# Patient Record
Sex: Male | Born: 1955 | Race: White | Hispanic: No | Marital: Married | Smoking: Never smoker
Health system: Southern US, Community
[De-identification: ages and names within clinical notes are randomized; demographics above are authoritative.]

## PROBLEM LIST (undated history)

## (undated) DIAGNOSIS — G2581 Restless legs syndrome: Secondary | ICD-10-CM

## (undated) DIAGNOSIS — N529 Male erectile dysfunction, unspecified: Secondary | ICD-10-CM

## (undated) DIAGNOSIS — I10 Essential (primary) hypertension: Secondary | ICD-10-CM

## (undated) DIAGNOSIS — T7840XA Allergy, unspecified, initial encounter: Secondary | ICD-10-CM

## (undated) DIAGNOSIS — I2510 Atherosclerotic heart disease of native coronary artery without angina pectoris: Secondary | ICD-10-CM

## (undated) DIAGNOSIS — E785 Hyperlipidemia, unspecified: Secondary | ICD-10-CM

## (undated) HISTORY — DX: Allergy, unspecified, initial encounter: T78.40XA

## (undated) HISTORY — DX: Hyperlipidemia, unspecified: E78.5

## (undated) HISTORY — DX: Restless legs syndrome: G25.81

## (undated) HISTORY — PX: FOREIGN BODY REMOVAL ABDOMINAL: SHX5319

## (undated) HISTORY — PX: CARDIAC CATHETERIZATION: SHX172

## (undated) HISTORY — DX: Male erectile dysfunction, unspecified: N52.9

## (undated) HISTORY — PX: COLONOSCOPY: SHX174

## (undated) HISTORY — DX: Essential (primary) hypertension: I10

## (undated) HISTORY — PX: WISDOM TOOTH EXTRACTION: SHX21

---

## 2007-02-21 ENCOUNTER — Ambulatory Visit: Payer: Self-pay | Admitting: Internal Medicine

## 2007-03-03 ENCOUNTER — Ambulatory Visit: Payer: Self-pay | Admitting: Internal Medicine

## 2015-10-11 ENCOUNTER — Other Ambulatory Visit: Payer: Self-pay | Admitting: Family Medicine

## 2015-10-11 DIAGNOSIS — R55 Syncope and collapse: Secondary | ICD-10-CM

## 2015-10-17 ENCOUNTER — Encounter: Payer: Self-pay | Admitting: *Deleted

## 2015-10-17 ENCOUNTER — Ambulatory Visit (INDEPENDENT_AMBULATORY_CARE_PROVIDER_SITE_OTHER): Payer: BLUE CROSS/BLUE SHIELD

## 2015-10-17 DIAGNOSIS — I10 Essential (primary) hypertension: Secondary | ICD-10-CM | POA: Insufficient documentation

## 2015-10-17 NOTE — Progress Notes (Signed)
Patient ID: Cory Hampton, male   DOB: [birth year], 60 y.o.   MRN: [id number] 24 hour ambulatory blood pressure monitor applied to patient.

## 2015-10-22 ENCOUNTER — Inpatient Hospital Stay: Admission: RE | Admit: 2015-10-22 | Payer: Self-pay | Source: Ambulatory Visit

## 2015-11-14 ENCOUNTER — Ambulatory Visit
Admission: RE | Admit: 2015-11-14 | Discharge: 2015-11-14 | Disposition: A | Discharge status: Home / Self Care | Payer: BLUE CROSS/BLUE SHIELD | Source: Ambulatory Visit | Attending: Family Medicine | Admitting: Family Medicine

## 2015-11-14 DIAGNOSIS — R55 Syncope and collapse: Secondary | ICD-10-CM

## 2015-12-11 ENCOUNTER — Encounter: Payer: Self-pay | Admitting: Neurology

## 2015-12-11 ENCOUNTER — Ambulatory Visit (INDEPENDENT_AMBULATORY_CARE_PROVIDER_SITE_OTHER): Payer: BLUE CROSS/BLUE SHIELD | Admitting: Neurology

## 2015-12-11 VITALS — BP 154/76 | HR 84 | Ht 68.75 in | Wt 166.6 lb

## 2015-12-11 DIAGNOSIS — R930 Abnormal findings on diagnostic imaging of skull and head, not elsewhere classified: Secondary | ICD-10-CM | POA: Diagnosis not present

## 2015-12-11 DIAGNOSIS — I10 Essential (primary) hypertension: Secondary | ICD-10-CM | POA: Diagnosis not present

## 2015-12-11 DIAGNOSIS — R9089 Other abnormal findings on diagnostic imaging of central nervous system: Secondary | ICD-10-CM

## 2015-12-11 NOTE — Progress Notes (Signed)
NEUROLOGY CONSULTATION NOTE  Lyman Balingit MRN: [id number] DOB: 1956-06-16  Referring provider: Dr. Paulino Rily Primary care provider: Dr. Paulino Rily  Reason for consult:  Abnormal MRI of brain  HISTORY OF PRESENT ILLNESS: Cory Hampton is a 60 year old male with hypertension who presents for abnormal MRI of brain.  History obtained by patient and PCP note.  Imaging of brain MRI reviewed.  He has hypertension as well as episodes of orthostatic hypotension.  When he was first put on antihypertensives several years ago, he became hypotensive and passed out.  He had a couple of other syncopal episodes as well.  He feels dizzy, specifically lightheadedness, with quick movements at times.  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  He denies memory or behavioral changes.    Both of his parents had TIAs.  His father passed away from Dementia with Lewy Body Disease.  PAST MEDICAL HISTORY: Past Medical History  Diagnosis Date  . Hypertension     PAST SURGICAL HISTORY: History reviewed. No pertinent past surgical history.  MEDICATIONS: No current outpatient prescriptions on file prior to visit.   No current facility-administered medications on file prior to visit.    ALLERGIES: Allergies  Allergen Reactions  . Statins Other (See Comments)    Weakness, join aches    FAMILY HISTORY: Family History  Problem Relation Age of Onset  . Stroke Mother   . Dementia Father   . Stroke Father     SOCIAL HISTORY: Social History   Social History  . Marital Status: Married    Spouse Name: N/A  . Number of Children: N/A  . Years of Education: N/A   Occupational History  . Not on file.   Social History Main Topics  .  Smoking status: Never Smoker   . Smokeless tobacco: Not on file  . Alcohol Use: Not on file  . Drug Use: Not on file  . Sexual Activity: Not on file   Other Topics Concern  . Not on file   Social History Narrative  . No narrative on file    REVIEW OF SYSTEMS: Constitutional: No fevers, chills, or sweats, no generalized fatigue, change in appetite Eyes: No visual changes, double vision, eye pain Ear, nose and throat: No hearing loss, ear pain, nasal congestion, sore throat Cardiovascular: No chest pain, palpitations Respiratory:  No shortness of breath at rest or with exertion, wheezes GastrointestinaI: No nausea, vomiting, diarrhea, abdominal pain, fecal incontinence Genitourinary:  No dysuria, urinary retention or frequency Musculoskeletal:  No neck pain, back pain Integumentary: No rash, pruritus, skin lesions Neurological: as above Psychiatric: No depression, insomnia, anxiety Endocrine: No palpitations, fatigue, diaphoresis, mood swings, change in appetite, change in weight, increased thirst Hematologic/Lymphatic:  No anemia, purpura, petechiae. Allergic/Immunologic: no itchy/runny eyes, nasal congestion, recent allergic reactions, rashes  PHYSICAL EXAM: Filed Vitals:   12/11/15 1311  BP: 154/76  Pulse: 84   General: No acute distress.  Patient appears well-groomed.  Head:  Normocephalic/atraumatic Eyes:  fundi examined but not visualized Neck: supple, no paraspinal tenderness, full range of motion Back: No paraspinal tenderness Heart: regular rate and rhythm Lungs: Clear to auscultation bilaterally. Vascular: No carotid bruits. Neurological Exam: Mental status: alert and oriented to person, place, and time, recent and remote memory  intact, fund of knowledge intact, attention and concentration intact, speech fluent and not dysarthric, language intact. Cranial nerves: CN I: not tested CN II: pupils equal, round and reactive to light, visual fields intact CN III, IV,  VI:  full range of motion, no nystagmus, no ptosis CN V: facial sensation intact CN VII: upper and lower face symmetric CN VIII: hearing intact CN IX, X: gag intact, uvula midline CN XI: sternocleidomastoid and trapezius muscles intact CN XII: tongue midline Bulk & Tone: normal, no fasciculations. Motor:  5/5 throughout Sensation: temperature and vibration sensation intact. Deep Tendon Reflexes:  2+ throughout, toes downgoing.  Finger to nose testing:  Without dysmetria.  Heel to shin:  Without dysmetria.  Gait:  Normal station and stride.  Able to turn and tandem walk. Romberg negative.  IMPRESSION: Mild bifrontal atrophy and punctate remote microhemorrhage on MRI of brain.   There appears to be two tiny old microhemorrhages next to each other.  This is likely sequela of hypertension.  Trauma may have caused this as well.  He does not exhibit scattered lobar microhemorrhages to suggest cerebral amyloid angiopathy.  The frontal atrophy, which is mild, is probably just his normal brain configuration and not necessarily pathologic.  He does not exhibit any signs of frontal lobe dysfunction, such as may be seen in frontotemporal dementia  HTN  PLAN: Repeat imaging is not indicated unless he exhibits new clinical signs or symptoms to suggest further evaluation.  Continue to optimize blood pressure control.  Follow up blood pressure with Dr. Paulino RilyWolters.  Follow up as needed.  Thank you for allowing me to take part in the care of this patient.  Shon MilletAdam Jaffe, DO  CC:  Mila PalmerSharon Wolters, MD

## 2015-12-11 NOTE — Patient Instructions (Signed)
I don't think the findings on the MRI are anything to worry about. 1.  You had tiny old microbleed in the brain, which may have been related to the hypertension 2.  There is some very mild shrinkage in the front of the brain, but that just may be how your brain looks.  Your exam and history is unremarkable.  No further testing is needed.  Follow up as needed.

## 2015-12-11 NOTE — Progress Notes (Signed)
Chart forwarded.  

## 2016-05-19 IMAGING — MR MR HEAD W/O CM
10 series · 48 of 48 positions shown · non-contrast
Comparison: None.

CLINICAL DATA: Presyncope

EXAM:
MRI HEAD WITHOUT CONTRAST
TECHNIQUE: Multiplanar, multiecho pulse sequences of the brain and surrounding
structures were obtained without intravenous contrast.

[Series 5: T1 · sagittal · 4.0mm · 0.75mm/px · 2 of 27 slices shown (1 of 2)]
[im 1/27]
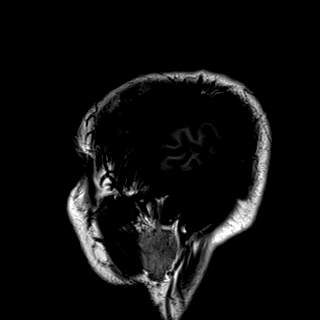
[im 27/27]
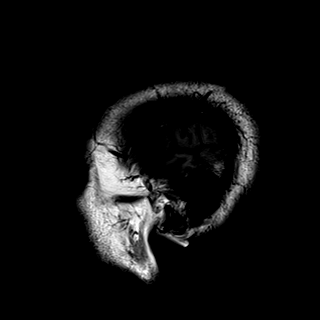

[Series 6: T2 · axial · 4.0mm · 0.36mm/px · z∈[-57,+89]mm · 2 of 29 slices shown (1 of 2)]
[im 1/29]
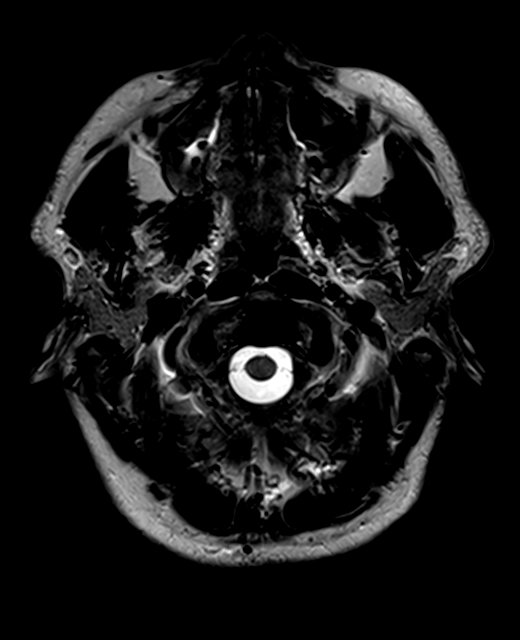
[im 29/29]
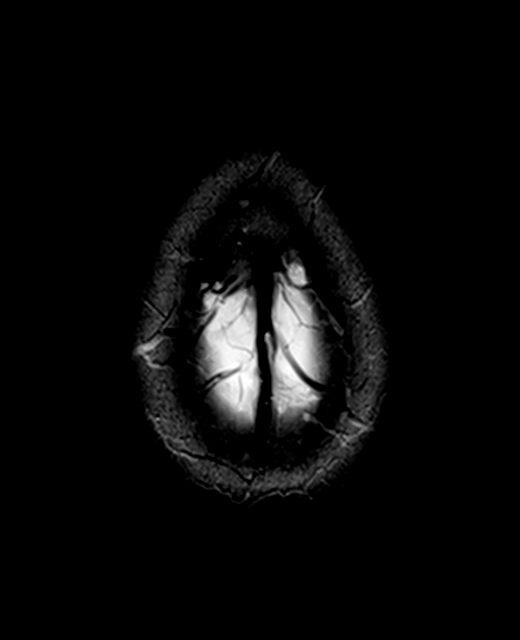

[Series 7: DWI · axial · 3.0mm · 1.44mm/px · z∈[-59,+89]mm · 8 of 92 slices shown (1 of 4)]
[im 1/92]
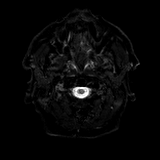
[im 14/92]
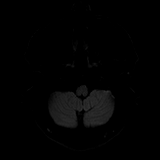
[im 27/92]
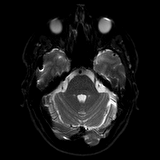
[im 40/92]
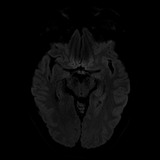
[im 53/92]
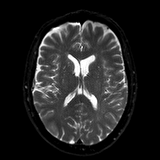
[im 66/92]
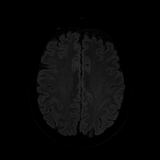
[im 79/92]
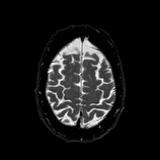
[im 92/92]
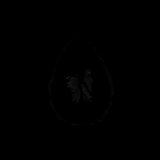

[Series 8: DWI · axial · 3.0mm · 1.44mm/px · z∈[-59,+89]mm · 4 of 46 slices shown (2 of 4)]
[im 1/46]
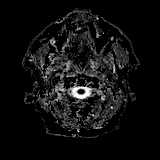
[im 16/46]
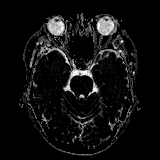
[im 31/46]
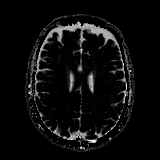
[im 46/46]
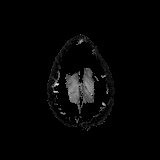

[Series 9: DWI · coronal · 5.0mm · 1.44mm/px · 5 of 60 slices shown (3 of 4)]
[im 1/60]
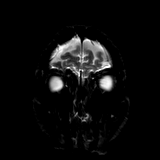
[im 15/60]
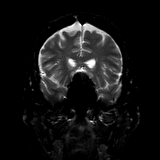
[im 30/60]
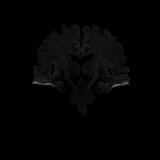
[im 45/60]
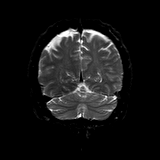
[im 60/60]
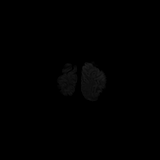

[Series 10: DWI · coronal · 5.0mm · 1.44mm/px · 3 of 30 slices shown (4 of 4)]
[im 1/30]
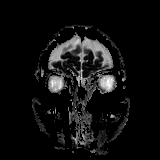
[im 15/30]
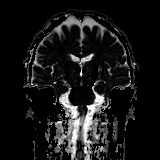
[im 30/30]
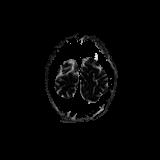

[Series 11: FLAIR · axial · 4.0mm · 0.72mm/px · z∈[-57,+89]mm · 2 of 29 slices shown]
[im 1/29]
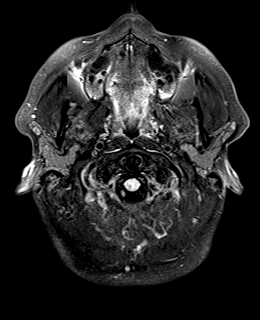
[im 29/29]
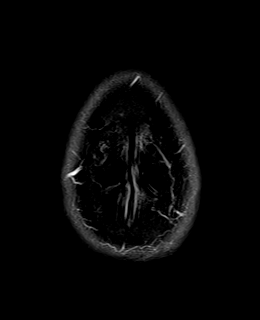

[Series 13: swi_images · axial · 2.0mm · 0.90mm/px · z∈[-63,+95]mm · 7 of 80 slices shown]
[im 1/80]
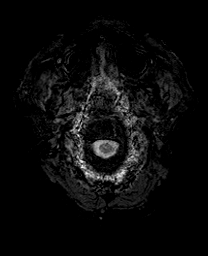
[im 14/80]
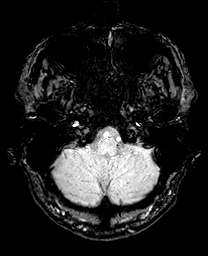
[im 27/80]
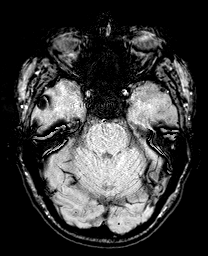
[im 40/80]
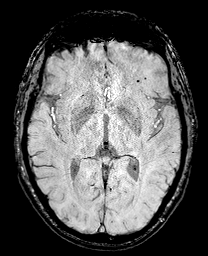
[im 53/80]
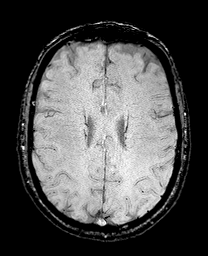
[im 66/80]
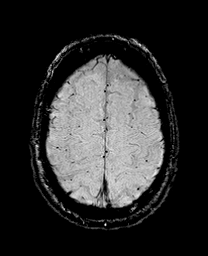
[im 80/80]
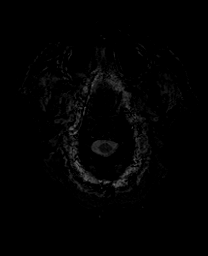

[Series 14: T1 · axial · 1.0mm · 0.90mm/px · z∈[-56,+87]mm · 12 of 144 slices shown (2 of 2)]
[im 1/144]
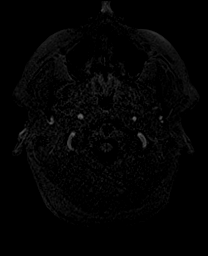
[im 14/144]
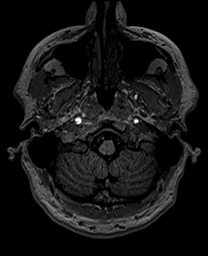
[im 27/144]
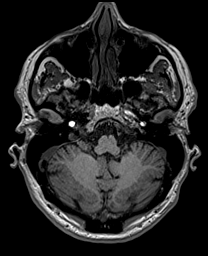
[im 40/144]
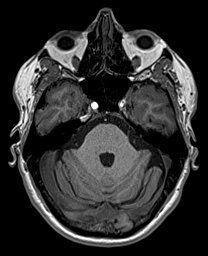
[im 53/144]
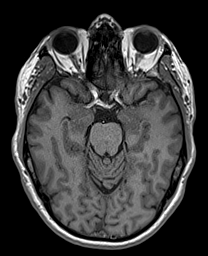
[im 66/144]
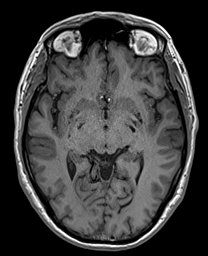
[im 79/144]
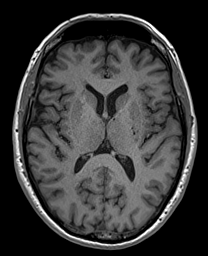
[im 92/144]
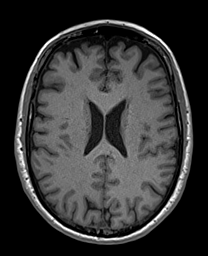
[im 105/144]
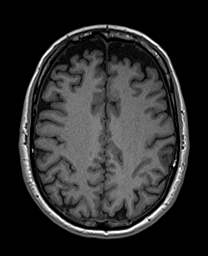
[im 118/144]
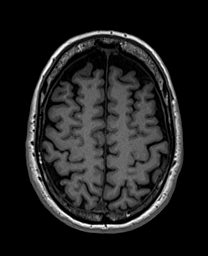
[im 131/144]
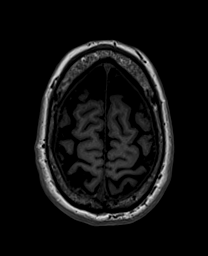
[im 144/144]
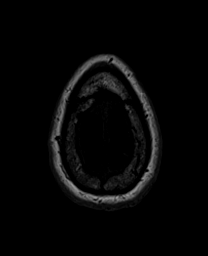

[Series 15: T2 · coronal · 4.5mm · 0.36mm/px · 3 of 30 slices shown (2 of 2)]
[im 1/30]
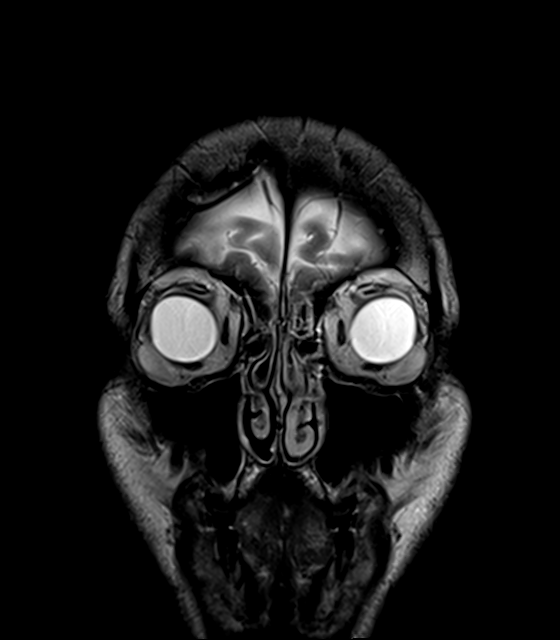
[im 15/30]
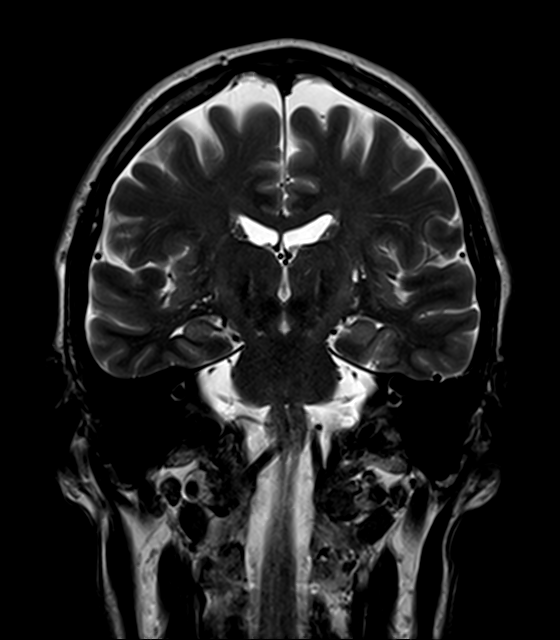
[im 30/30]
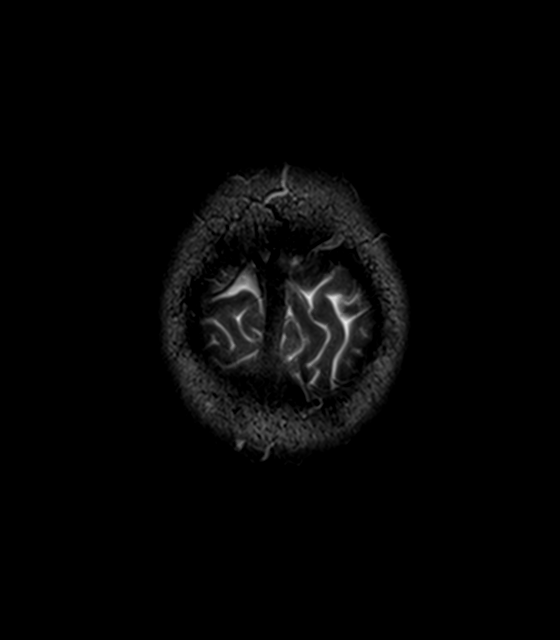

[48 of 48 positions shown; findings below may reference images not displayed]

FINDINGS: Mild frontal lobe atrophy.  Negative for hydrocephalus.

Negative for acute infarct. Several small white matter
hyperintensities compatible with minimal chronic white matter
change. Brainstem and cerebellum normal.

Chronic micro hemorrhage left frontal lobe.

Negative for mass or edema.  No shift of the midline structures.

Pituitary normal.  Normal skullbase

Mild mucosal edema paranasal sinuses.  Normal orbit.
IMPRESSION: Minimal chronic white matter changes likely related to microvascular
ischemia.

Mild chronic micro hemorrhage left frontal lobe, question
hypertension

No acute abnormality.

## 2017-01-13 ENCOUNTER — Encounter: Payer: Self-pay | Admitting: Internal Medicine

## 2017-03-29 DIAGNOSIS — L501 Idiopathic urticaria: Secondary | ICD-10-CM | POA: Diagnosis not present

## 2017-04-13 DIAGNOSIS — L501 Idiopathic urticaria: Secondary | ICD-10-CM | POA: Diagnosis not present

## 2017-04-27 ENCOUNTER — Encounter: Payer: Self-pay | Admitting: Internal Medicine

## 2017-04-27 DIAGNOSIS — L501 Idiopathic urticaria: Secondary | ICD-10-CM | POA: Diagnosis not present

## 2017-05-11 DIAGNOSIS — L501 Idiopathic urticaria: Secondary | ICD-10-CM | POA: Diagnosis not present

## 2017-05-25 DIAGNOSIS — L501 Idiopathic urticaria: Secondary | ICD-10-CM | POA: Diagnosis not present

## 2017-06-10 DIAGNOSIS — L501 Idiopathic urticaria: Secondary | ICD-10-CM | POA: Diagnosis not present

## 2017-06-15 ENCOUNTER — Ambulatory Visit (AMBULATORY_SURGERY_CENTER): Payer: Self-pay

## 2017-06-15 VITALS — Ht 69.0 in | Wt 174.0 lb

## 2017-06-15 DIAGNOSIS — Z1211 Encounter for screening for malignant neoplasm of colon: Secondary | ICD-10-CM

## 2017-06-15 MED ORDER — SUPREP BOWEL PREP KIT 17.5-3.13-1.6 GM/177ML PO SOLN: 1.0000 | Freq: Once | ORAL | 0 refills | Status: AC

## 2017-06-15 NOTE — Progress Notes (Signed)
No allergies to eggs or soy No past problems with anesthesia No home oxygen No diet meds  Declined emmi 

## 2017-06-22 DIAGNOSIS — L501 Idiopathic urticaria: Secondary | ICD-10-CM | POA: Diagnosis not present

## 2017-06-29 ENCOUNTER — Encounter: Payer: BLUE CROSS/BLUE SHIELD | Admitting: Internal Medicine

## 2017-06-30 ENCOUNTER — Encounter: Payer: Self-pay | Admitting: Internal Medicine

## 2017-07-06 DIAGNOSIS — L501 Idiopathic urticaria: Secondary | ICD-10-CM | POA: Diagnosis not present

## 2017-07-08 ENCOUNTER — Encounter: Payer: Self-pay | Admitting: Internal Medicine

## 2017-07-08 ENCOUNTER — Other Ambulatory Visit: Payer: Self-pay

## 2017-07-08 ENCOUNTER — Ambulatory Visit (AMBULATORY_SURGERY_CENTER): Payer: BLUE CROSS/BLUE SHIELD | Admitting: Internal Medicine

## 2017-07-08 VITALS — BP 136/84 | HR 77 | Temp 96.2°F | Resp 14 | Ht 69.0 in | Wt 174.0 lb

## 2017-07-08 DIAGNOSIS — Z1212 Encounter for screening for malignant neoplasm of rectum: Secondary | ICD-10-CM

## 2017-07-08 DIAGNOSIS — Z1211 Encounter for screening for malignant neoplasm of colon: Secondary | ICD-10-CM

## 2017-07-08 MED ORDER — SODIUM CHLORIDE 0.9 % IV SOLN: 500.0000 mL | INTRAVENOUS | Status: DC

## 2017-07-08 NOTE — Op Note (Signed)
[facility] Patient Name: Cory Hampton Procedure Date: 07/08/2017 4:18 PM MRN: [id number] Endoscopist: Wilhemina BonitoJohn N. Marina GoodellPerry , MD Age: 89 Referring MD:  Date of Birth: 09-22-1955 Gender: Male Account #: [id number] Procedure:                Colonoscopy Indications:              Screening for colorectal malignant neoplasm.                            Negative index exam 2008 Medicines:                Monitored Anesthesia Care Procedure:                Pre-Anesthesia Assessment:                           - Prior to the procedure, a History and Physical                            was performed, and patient medications and                            allergies were reviewed. The patient's tolerance of                            previous anesthesia was also reviewed. The risks                            and benefits of the procedure and the sedation                            options and risks were discussed with the patient.                            All questions were answered, and informed consent                            was obtained. Prior Anticoagulants: The patient has                            taken no previous anticoagulant or antiplatelet                            agents. ASA Grade Assessment: II - A patient with                            mild systemic disease. After reviewing the risks                            and benefits, the patient was deemed in                            satisfactory condition to undergo the procedure.  After obtaining informed consent, the colonoscope                            was passed under direct vision. Throughout the                            procedure, the patient's blood pressure, pulse, and                            oxygen saturations were monitored continuously. The                            Colonoscope was introduced through the anus and                            advanced to the the cecum, identified by                       appendiceal orifice and ileocecal valve. The                            ileocecal valve, appendiceal orifice, and rectum                            were photographed. The quality of the bowel                            preparation was excellent. The colonoscopy was                            performed without difficulty. The patient tolerated                            the procedure well. The bowel preparation used was                            SUPREP. Scope In: 4:28:21 PM Scope Out: 4:45:02 PM Scope Withdrawal Time: 0 hours 14 minutes 1 second  Total Procedure Duration: 0 hours 16 minutes 41 seconds  Findings:                 Internal hemorrhoids were found during                            retroflexion. The hemorrhoids were small.                           The exam was otherwise without abnormality on                            direct and retroflexion views. Complications:            No immediate complications. Estimated blood loss:                            None. Estimated Blood Loss:     Estimated blood  loss: none. Impression:               - Internal hemorrhoids.                           - The examination was otherwise normal on direct                            and retroflexion views.                           - No specimens collected. Recommendation:           - Repeat colonoscopy in 10 years for screening                            purposes.                           - Patient has a contact number available for                            emergencies. The signs and symptoms of potential                            delayed complications were discussed with the                            patient. Return to normal activities tomorrow.                            Written discharge instructions were provided to the                            patient.                           - Resume previous diet.                           - Continue present medications. Wilhemina BonitoJohn  N. Marina GoodellPerry, MD 07/08/2017 4:48:05 PM This report has been signed electronically.

## 2017-07-08 NOTE — Patient Instructions (Signed)
YOU HAD AN ENDOSCOPIC PROCEDURE TODAY AT THE [facility]:   Refer to the procedure report that was given to you for any specific questions about what was found during the examination.  If the procedure report does not answer your questions, please call your gastroenterologist to clarify.  If you requested that your care partner not be given the details of your procedure findings, then the procedure report has been included in a sealed envelope for you to review at your convenience later.  YOU SHOULD EXPECT: Some feelings of bloating in the abdomen. Passage of more gas than usual.  Walking can help get rid of the air that was put into your GI tract during the procedure and reduce the bloating. If you had a lower endoscopy (such as a colonoscopy or flexible sigmoidoscopy) you may notice spotting of blood in your stool or on the toilet paper. If you underwent a bowel prep for your procedure, you may not have a normal bowel movement for a few days.  Please Note:  You might notice some irritation and congestion in your nose or some drainage.  This is from the oxygen used during your procedure.  There is no need for concern and it should clear up in a day or so.  SYMPTOMS TO REPORT IMMEDIATELY:   Following lower endoscopy (colonoscopy or flexible sigmoidoscopy):  Excessive amounts of blood in the stool  Significant tenderness or worsening of abdominal pains  Swelling of the abdomen that is new, acute  Fever of 100F or higher   For urgent or emergent issues, a gastroenterologist can be reached at any hour by calling [phone].  Please see handout on Hemorrhoids.  DIET:  We do recommend a small meal at first, but then you may proceed to your regular diet.  Drink plenty of fluids but you should avoid alcoholic beverages for 24 hours.  ACTIVITY:  You should plan to take it easy for the rest of today and you should NOT DRIVE or use heavy machinery until tomorrow (because of the sedation  medicines used during the test).    FOLLOW UP: Our staff will call the number listed on your records the next business day following your procedure to check on you and address any questions or concerns that you may have regarding the information given to you following your procedure. If we do not reach you, we will leave a message.  However, if you are feeling well and you are not experiencing any problems, there is no need to return our call.  We will assume that you have returned to your regular daily activities without incident.  If any biopsies were taken you will be contacted by phone or by letter within the next 1-3 weeks.  Please call us at [phone] if you have not heard about the biopsies in 3 weeks.    SIGNATURES/CONFIDENTIALITY: You and/or your care partner have signed paperwork which will be entered into your electronic medical record.  These signatures attest to the fact that that the information above on your After Visit Summary has been reviewed and is understood.  Full responsibility of the confidentiality of this discharge information lies with you and/or your care-partner.  Thank you for letting us take care of your healthcare needs today.

## 2017-07-08 NOTE — Progress Notes (Signed)
A/ox3 pleased with MAC, report to R[facility]s

## 2017-07-09 ENCOUNTER — Telehealth: Payer: Self-pay | Admitting: *Deleted

## 2017-07-09 NOTE — Telephone Encounter (Signed)
  Follow up Call-  Call back number 07/08/2017  Post procedure Call Back phone  # [phone]  Permission to leave phone message Yes  Some recent data might be hidden     Patient questions:  Do you have a fever, pain , or abdominal swelling? No. Pain Score  0 *  Have you tolerated food without any problems? Yes.    Have you been able to return to your normal activities? Yes.    Do you have any questions about your discharge instructions: Diet   No. Medications  No. Follow up visit  No.  Do you have questions or concerns about your Care? No.  Actions: * If pain score is 4 or above: No action needed, pain <4.

## 2017-07-23 DIAGNOSIS — H1045 Other chronic allergic conjunctivitis: Secondary | ICD-10-CM | POA: Diagnosis not present

## 2017-07-23 DIAGNOSIS — J309 Allergic rhinitis, unspecified: Secondary | ICD-10-CM | POA: Diagnosis not present

## 2017-07-23 DIAGNOSIS — R21 Rash and other nonspecific skin eruption: Secondary | ICD-10-CM | POA: Diagnosis not present

## 2017-07-23 DIAGNOSIS — L501 Idiopathic urticaria: Secondary | ICD-10-CM | POA: Diagnosis not present

## 2017-08-04 DIAGNOSIS — L501 Idiopathic urticaria: Secondary | ICD-10-CM | POA: Diagnosis not present

## 2017-08-25 DIAGNOSIS — L501 Idiopathic urticaria: Secondary | ICD-10-CM | POA: Diagnosis not present

## 2017-09-08 DIAGNOSIS — L501 Idiopathic urticaria: Secondary | ICD-10-CM | POA: Diagnosis not present

## 2017-09-21 DIAGNOSIS — L501 Idiopathic urticaria: Secondary | ICD-10-CM | POA: Diagnosis not present

## 2017-10-05 DIAGNOSIS — L501 Idiopathic urticaria: Secondary | ICD-10-CM | POA: Diagnosis not present

## 2017-10-19 DIAGNOSIS — L501 Idiopathic urticaria: Secondary | ICD-10-CM | POA: Diagnosis not present

## 2017-11-16 DIAGNOSIS — L501 Idiopathic urticaria: Secondary | ICD-10-CM | POA: Diagnosis not present

## 2017-12-13 DIAGNOSIS — L501 Idiopathic urticaria: Secondary | ICD-10-CM | POA: Diagnosis not present

## 2018-01-13 DIAGNOSIS — L501 Idiopathic urticaria: Secondary | ICD-10-CM | POA: Diagnosis not present

## 2018-01-27 DIAGNOSIS — H1045 Other chronic allergic conjunctivitis: Secondary | ICD-10-CM | POA: Diagnosis not present

## 2018-01-27 DIAGNOSIS — J309 Allergic rhinitis, unspecified: Secondary | ICD-10-CM | POA: Diagnosis not present

## 2018-01-27 DIAGNOSIS — L501 Idiopathic urticaria: Secondary | ICD-10-CM | POA: Diagnosis not present

## 2018-01-27 DIAGNOSIS — R21 Rash and other nonspecific skin eruption: Secondary | ICD-10-CM | POA: Diagnosis not present

## 2018-02-15 DIAGNOSIS — L501 Idiopathic urticaria: Secondary | ICD-10-CM | POA: Diagnosis not present

## 2018-03-25 DIAGNOSIS — L501 Idiopathic urticaria: Secondary | ICD-10-CM | POA: Diagnosis not present

## 2018-04-28 DIAGNOSIS — L501 Idiopathic urticaria: Secondary | ICD-10-CM | POA: Diagnosis not present

## 2018-06-01 DIAGNOSIS — L501 Idiopathic urticaria: Secondary | ICD-10-CM | POA: Diagnosis not present

## 2018-07-07 DIAGNOSIS — L501 Idiopathic urticaria: Secondary | ICD-10-CM | POA: Diagnosis not present

## 2018-08-08 DIAGNOSIS — L501 Idiopathic urticaria: Secondary | ICD-10-CM | POA: Diagnosis not present

## 2018-09-01 DIAGNOSIS — H1045 Other chronic allergic conjunctivitis: Secondary | ICD-10-CM | POA: Diagnosis not present

## 2018-09-01 DIAGNOSIS — L501 Idiopathic urticaria: Secondary | ICD-10-CM | POA: Diagnosis not present

## 2018-09-01 DIAGNOSIS — R21 Rash and other nonspecific skin eruption: Secondary | ICD-10-CM | POA: Diagnosis not present

## 2018-09-01 DIAGNOSIS — J309 Allergic rhinitis, unspecified: Secondary | ICD-10-CM | POA: Diagnosis not present

## 2018-09-15 DIAGNOSIS — L501 Idiopathic urticaria: Secondary | ICD-10-CM | POA: Diagnosis not present

## 2018-10-19 DIAGNOSIS — L501 Idiopathic urticaria: Secondary | ICD-10-CM | POA: Diagnosis not present

## 2018-11-17 DIAGNOSIS — L501 Idiopathic urticaria: Secondary | ICD-10-CM | POA: Diagnosis not present

## 2018-12-16 DIAGNOSIS — L501 Idiopathic urticaria: Secondary | ICD-10-CM | POA: Diagnosis not present

## 2019-01-20 DIAGNOSIS — L501 Idiopathic urticaria: Secondary | ICD-10-CM | POA: Diagnosis not present

## 2019-02-17 DIAGNOSIS — L508 Other urticaria: Secondary | ICD-10-CM | POA: Diagnosis not present

## 2019-02-17 DIAGNOSIS — I10 Essential (primary) hypertension: Secondary | ICD-10-CM | POA: Diagnosis not present

## 2019-02-21 DIAGNOSIS — L501 Idiopathic urticaria: Secondary | ICD-10-CM | POA: Diagnosis not present

## 2019-02-23 DIAGNOSIS — Z23 Encounter for immunization: Secondary | ICD-10-CM | POA: Diagnosis not present

## 2019-03-31 DIAGNOSIS — L501 Idiopathic urticaria: Secondary | ICD-10-CM | POA: Diagnosis not present

## 2019-04-26 DIAGNOSIS — Z23 Encounter for immunization: Secondary | ICD-10-CM | POA: Diagnosis not present

## 2019-05-05 DIAGNOSIS — L501 Idiopathic urticaria: Secondary | ICD-10-CM | POA: Diagnosis not present

## 2019-06-16 DIAGNOSIS — L501 Idiopathic urticaria: Secondary | ICD-10-CM | POA: Diagnosis not present

## 2019-07-25 DIAGNOSIS — L501 Idiopathic urticaria: Secondary | ICD-10-CM | POA: Diagnosis not present

## 2019-08-31 DIAGNOSIS — J309 Allergic rhinitis, unspecified: Secondary | ICD-10-CM | POA: Diagnosis not present

## 2019-08-31 DIAGNOSIS — R21 Rash and other nonspecific skin eruption: Secondary | ICD-10-CM | POA: Diagnosis not present

## 2019-08-31 DIAGNOSIS — H1045 Other chronic allergic conjunctivitis: Secondary | ICD-10-CM | POA: Diagnosis not present

## 2019-08-31 DIAGNOSIS — L501 Idiopathic urticaria: Secondary | ICD-10-CM | POA: Diagnosis not present

## 2019-10-05 DIAGNOSIS — L501 Idiopathic urticaria: Secondary | ICD-10-CM | POA: Diagnosis not present

## 2019-11-13 DIAGNOSIS — L501 Idiopathic urticaria: Secondary | ICD-10-CM | POA: Diagnosis not present

## 2019-12-19 DIAGNOSIS — L501 Idiopathic urticaria: Secondary | ICD-10-CM | POA: Diagnosis not present

## 2020-01-24 DIAGNOSIS — L501 Idiopathic urticaria: Secondary | ICD-10-CM | POA: Diagnosis not present

## 2020-03-01 DIAGNOSIS — L501 Idiopathic urticaria: Secondary | ICD-10-CM | POA: Diagnosis not present

## 2020-04-03 DIAGNOSIS — L501 Idiopathic urticaria: Secondary | ICD-10-CM | POA: Diagnosis not present

## 2020-05-09 DIAGNOSIS — L501 Idiopathic urticaria: Secondary | ICD-10-CM | POA: Diagnosis not present

## 2020-06-13 DIAGNOSIS — L501 Idiopathic urticaria: Secondary | ICD-10-CM | POA: Diagnosis not present

## 2020-07-22 DIAGNOSIS — L501 Idiopathic urticaria: Secondary | ICD-10-CM | POA: Diagnosis not present

## 2020-10-14 ENCOUNTER — Other Ambulatory Visit: Payer: Self-pay | Admitting: Family Medicine

## 2020-10-14 DIAGNOSIS — R0609 Other forms of dyspnea: Secondary | ICD-10-CM

## 2020-10-14 DIAGNOSIS — R079 Chest pain, unspecified: Secondary | ICD-10-CM

## 2020-10-14 DIAGNOSIS — R0600 Dyspnea, unspecified: Secondary | ICD-10-CM

## 2020-10-14 DIAGNOSIS — I2510 Atherosclerotic heart disease of native coronary artery without angina pectoris: Secondary | ICD-10-CM

## 2020-11-06 ENCOUNTER — Ambulatory Visit (HOSPITAL_COMMUNITY)
Admission: RE | Admit: 2020-11-06 | Discharge: 2020-11-06 | Disposition: A | Discharge status: Home / Self Care | Payer: 59 | Source: Ambulatory Visit | Attending: Family Medicine | Admitting: Family Medicine

## 2020-11-06 ENCOUNTER — Other Ambulatory Visit: Payer: Self-pay

## 2020-11-06 DIAGNOSIS — E785 Hyperlipidemia, unspecified: Secondary | ICD-10-CM | POA: Insufficient documentation

## 2020-11-06 DIAGNOSIS — R079 Chest pain, unspecified: Secondary | ICD-10-CM | POA: Diagnosis not present

## 2020-11-06 DIAGNOSIS — R0600 Dyspnea, unspecified: Secondary | ICD-10-CM | POA: Diagnosis not present

## 2020-11-06 DIAGNOSIS — R0609 Other forms of dyspnea: Secondary | ICD-10-CM

## 2020-11-06 DIAGNOSIS — I10 Essential (primary) hypertension: Secondary | ICD-10-CM | POA: Diagnosis not present

## 2020-11-06 LAB — ECHOCARDIOGRAM COMPLETE
Area-P 1/2: 4.31 cm2
S' Lateral: 2.7 cm

## 2020-11-06 NOTE — Progress Notes (Signed)
  Echocardiogram 2D Echocardiogram has been performed.  Augustine Radar 11/06/2020, 2:01 PM

## 2020-11-08 ENCOUNTER — Other Ambulatory Visit: Payer: Self-pay | Admitting: Family Medicine

## 2020-11-08 ENCOUNTER — Other Ambulatory Visit (HOSPITAL_COMMUNITY): Payer: Self-pay

## 2020-11-08 DIAGNOSIS — R0989 Other specified symptoms and signs involving the circulatory and respiratory systems: Secondary | ICD-10-CM

## 2020-11-08 NOTE — Progress Notes (Signed)
64yoM w/ intermittent chest burning. Started after running out of Xolair and pt described sx as similar to his pleurisy prior to starting Xolair. 14 days ago pt started on Prilosec 20mg  BID w/ improvement in sx. Subsequent resumption of Xolair has not changed his sx of chest burning. Pt w/ som DOE. Echo performed on 11/07/20 (in Epic) was nml w/ the exception of trivial mitral regurge. EF 55-60% w/ no diastolic dysfunction or hypokinesis. ASCVD 13%. H/o HTN, HLD (elevated LDL).

## 2020-11-08 NOTE — Addendum Note (Signed)
Addended by: Konrad Dolores, Yolunda Kloos J on: 11/08/2020 12:27 PM   Modules accepted: Orders

## 2020-11-11 ENCOUNTER — Telehealth (HOSPITAL_COMMUNITY): Payer: Self-pay

## 2020-11-11 NOTE — Telephone Encounter (Signed)
Detailed instructions left on the patient's answering machine. Asked to call back with any questions. S.Williiams EMTP

## 2020-11-12 ENCOUNTER — Ambulatory Visit (HOSPITAL_COMMUNITY): Payer: 59 | Attending: Cardiovascular Disease

## 2020-11-12 ENCOUNTER — Other Ambulatory Visit: Payer: Self-pay

## 2020-11-12 DIAGNOSIS — Z9189 Other specified personal risk factors, not elsewhere classified: Secondary | ICD-10-CM | POA: Diagnosis not present

## 2020-11-12 DIAGNOSIS — R0989 Other specified symptoms and signs involving the circulatory and respiratory systems: Secondary | ICD-10-CM | POA: Diagnosis present

## 2020-11-12 LAB — MYOCARDIAL PERFUSION IMAGING
LV dias vol: 86 mL (ref 62–150)
LV sys vol: 32 mL
Peak HR: 100 {beats}/min
Rest HR: 69 {beats}/min
SDS: 1
SRS: 0
SSS: 1
TID: 1.09

## 2020-11-12 MED ORDER — TECHNETIUM TC 99M TETROFOSMIN IV KIT
10.7000 | PACK | Freq: Once | INTRAVENOUS | Status: AC | PRN
  Administered 2020-11-12: 10.7 via INTRAVENOUS
  Filled 2020-11-12: qty 11

## 2020-11-12 MED ORDER — TECHNETIUM TC 99M TETROFOSMIN IV KIT
32.4000 | PACK | Freq: Once | INTRAVENOUS | Status: AC | PRN
  Administered 2020-11-12: 32.4 via INTRAVENOUS
  Filled 2020-11-12: qty 33

## 2020-11-12 MED ORDER — REGADENOSON 0.4 MG/5ML IV SOLN
0.4000 mg | Freq: Once | INTRAVENOUS | Status: AC
  Administered 2020-11-12: 0.4 mg via INTRAVENOUS

## 2020-11-13 ENCOUNTER — Ambulatory Visit (HOSPITAL_COMMUNITY): Payer: 59

## 2020-11-13 ENCOUNTER — Ambulatory Visit (HOSPITAL_COMMUNITY): Payer: BLUE CROSS/BLUE SHIELD

## 2020-11-28 NOTE — Progress Notes (Signed)
Error

## 2020-11-29 ENCOUNTER — Ambulatory Visit: Payer: Self-pay | Admitting: Cardiology

## 2020-11-29 DIAGNOSIS — R0600 Dyspnea, unspecified: Secondary | ICD-10-CM | POA: Insufficient documentation

## 2020-11-29 DIAGNOSIS — R072 Precordial pain: Secondary | ICD-10-CM | POA: Insufficient documentation

## 2020-11-29 DIAGNOSIS — R0609 Other forms of dyspnea: Secondary | ICD-10-CM | POA: Insufficient documentation

## 2020-12-19 NOTE — H&P (View-Only) (Signed)
Date:  12/23/2020   ID:  Cory FuchsKim Robert Gentile, DOB [birth year], MRN [id number]  PCP:  Mila PalmerWolters, Sharon, MD  Cardiologist:  Tessa LernerSunit Lola Lofaro, DO, [facility] (established care 12/23/2020) Former Cardiology Providers: Dr. Jacinto HalimGanji   REASON FOR CONSULT: Chest pain   REQUESTING PHYSICIAN:  Dr. Shelly Flattenavid [facility]  [address] [place], [place] [id number].   Chief Complaint  Patient presents with  . Chest Pain  . Hypertension  . New Patient (Initial Visit)    HPI  Cory Hampton is a 65 y.o. male who presents to the office with a chief complaint of " chest pain." Patient's past medical history and cardiovascular risk factors include: hypertension, hyperlipidemia, ED, restless leg syndrome.   He is referred to the office at the request of Dr. Shelly Flattenavid Merrell for evaluation of chest pain.  Patient states that he has been experiencing chest pain and bilateral arm pain since February 2022.  He is an avid walker and tries to walk 4 miles or up to 10,000 steps on a daily basis.  Since February he started noticing pain in his anterior chest wall and bilateral arms with effort related activities.  When he were to walk on a treadmill more than 3.5 mph the symptoms would surface and resolve after the cessation of activity.  [facility] echocardi[facility] and stress test as noted below.  Thereafter the symptoms subsided until recently he is experiencing chest pain which is dull/ache-like sensation and at times pressure, radiates to both arms, worse with effort related activities, resolving with rest.  Patient does have history of hypercholesterolemia.  However has been intolerant to statin therapy and therefore is not on statins as of now but currently takes omega-3's.  Patient is currently on aspirin 81 mg p.o. daily but no known CAD/CVA.Marland Kitchen.  No complaints of cough in the last labs family history of premature coronary disease or sudden cardiac  death.  FUNCTIONAL STATUS: Walk 4 miles a day or 10,000 steps a day.    ALLERGIES: Allergies  Allergen Reactions  . Statins Other (See Comments)    Weakness, join aches    MEDICATION LIST PRIOR TO VISIT: Current Meds  Medication Sig  . aspirin 81 MG chewable tablet Chew by mouth daily.  . cetirizine (ZYRTEC) 5 MG tablet Take 5 mg by mouth daily.  . Cholecalciferol (VITAMIN D) 2000 units CAPS Take by mouth.  . ezetimibe (ZETIA) 10 MG tablet Take 1 tablet (10 mg total) by mouth daily.  . hydrochlorothiazide (HYDRODIURIL) 25 MG tablet Take 25 mg by mouth daily.  Marland Kitchen. losartan (COZAAR) 50 MG tablet Take 50 mg by mouth daily.  . Magnesium 400 MG TABS Take by mouth.  . metoprolol succinate (TOPROL XL) 25 MG 24 hr tablet Take 1 tablet (25 mg total) by mouth daily.  . nitroGLYCERIN (NITROSTAT) 0.4 MG SL tablet Place 1 tablet (0.4 mg total) under the tongue every 5 (five) minutes as needed for chest pain. If you require more than two tablets five minutes apart go to the nearest ER via EMS.  Marland Kitchen. omalizumab (XOLAIR) 150 MG injection Inject into the skin every 28 (twenty-eight) days.  . Omega-3 Fatty Acids (OMEGA-3 PO) Take 2,000 mg by mouth.  Marland Kitchen. OVER THE COUNTER MEDICATION Cholest-OFF 300mg  po  . vitamin B-12 (CYANOCOBALAMIN) 1000 MCG tablet Take 1,000 mcg by mouth daily.     PAST MEDICAL HISTORY: Past Medical History:  Diagnosis Date  . Allergy   .  Erectile dysfunction   . Hyperlipidemia   . Hypertension   . Restless leg syndrome     PAST SURGICAL HISTORY: Past Surgical History:  Procedure Laterality Date  . COLONOSCOPY    . FOREIGN BODY REMOVAL ABDOMINAL     age 20  . WISDOM TOOTH EXTRACTION      FAMILY HISTORY: The patient family history includes Dementia in his father; Hypertension in his sister; Stroke in his father and mother.  SOCIAL HISTORY:  The patient  reports that he has never smoked. He has never used smokeless tobacco. He reports current alcohol use of about 4.0  standard drinks of alcohol per week. He reports that he does not use drugs.  REVIEW OF SYSTEMS: Review of Systems  Constitutional: Negative for chills and fever.  HENT: Negative for hoarse voice and nosebleeds.   Eyes: Negative for discharge, double vision and pain.  Cardiovascular: Positive for chest pain. Negative for claudication, dyspnea on exertion, leg swelling, near-syncope, orthopnea, palpitations, paroxysmal nocturnal dyspnea and syncope.  Respiratory: Negative for hemoptysis and shortness of breath.   Musculoskeletal: Negative for muscle cramps and myalgias.  Gastrointestinal: Negative for abdominal pain, constipation, diarrhea, hematemesis, hematochezia, melena, nausea and vomiting.  Neurological: Negative for dizziness and light-headedness.    PHYSICAL EXAM: Vitals with BMI 12/23/2020 11/12/2020 07/08/2017  Height 5\' 9"  5\' 9"  -  Weight 171 lbs 174 lbs -  BMI 25.24 25.68 -  Systolic 138 - 136  Diastolic 82 - 84  Pulse 80 - 77    CONSTITUTIONAL: Well-developed and well-nourished. No acute distress.  SKIN: Skin is warm and dry. No rash noted. No cyanosis. No pallor. No jaundice HEAD: Normocephalic and atraumatic.  EYES: No scleral icterus MOUTH/THROAT: Moist oral membranes.  NECK: No JVD present. No thyromegaly noted. No carotid bruits  LYMPHATIC: No visible cervical adenopathy.  CHEST Normal respiratory effort. No intercostal retractions  LUNGS: Clear to auscultation bilaterally.  No stridor. No wheezes. No rales.  CARDIOVASCULAR: Regular rate and rhythm, positive S1-S2, no murmurs rubs or gallops appreciated. ABDOMINAL: No apparent ascites.  EXTREMITIES: No peripheral edema  HEMATOLOGIC: No significant bruising NEUROLOGIC: Oriented to person, place, and time. Nonfocal. Normal muscle tone.  PSYCHIATRIC: Normal mood and affect. Normal behavior. Cooperative  CARDIAC DATABASE: EKG: 12/23/2020: Normal sinus rhythm, 85 bpm, normal axis, left atrial enlargement, without  underlying ischemia or injury pattern.   Echocardiogram: 11/06/2020: LVEF 55 to 60%, no regional wall motion abnormalities, normal diastolic function, trivial MR.  Stress Testing: 11/12/2020 MPI:  Nuclear stress EF: 63%.  The left ventricular ejection fraction is normal (55-65%).  There was no ST segment deviation noted during stress.  No T wave inversion was noted during stress.  Defect 1: There is a small defect of moderate severity present in the apex location.  Findings consistent with ischemia. However, there is normal wall motion. Cannot rule out apical thinning.  This is a low risk study  Heart Catheterization: None  LABORATORY DATA: No flowsheet data found.  No flowsheet data found.  Lipid Panel  No results found for: CHOL, TRIG, HDL, CHOLHDL, VLDL, LDLCALC, LDLDIRECT, LABVLDL  No components found for: NTPROBNP No results for input(s): PROBNP in the last 8760 hours. No results for input(s): TSH in the last 8760 hours.  BMP No results for input(s): NA, K, CL, CO2, GLUCOSE, BUN, CREATININE, CALCIUM, GFRNONAA, GFRAA in the last 8760 hours.  HEMOGLOBIN A1C No results found for: HGBA1C, MPG  IMPRESSION:    ICD-10-CM   1. Angina pectoris (HCC)  I20.9 EKG 12-Lead    Basic metabolic panel    Magnesium    Lipid Panel With LDL/HDL Ratio    LDL cholesterol, direct    CBC    metoprolol succinate (TOPROL XL) 25 MG 24 hr tablet    nitroGLYCERIN (NITROSTAT) 0.4 MG SL tablet    SARS-COV-2 RNA,(COVID-19) QUAL NAAT  2. Hypertension, essential  I10   3. Pure hypercholesterolemia  E78.00 ezetimibe (ZETIA) 10 MG tablet  4. Erectile dysfunction, unspecified erectile dysfunction type  N52.9      RECOMMENDATIONS: Dvontae Ruan is a 65 y.o. male whose past medical history and cardiac risk factors include: hypertension, hyperlipidemia, ED, restless leg syndrome.   Angina pectoris: Patient symptoms are classic for anginal discomfort. EKG shows normal sinus rhythm  without underlying ischemic injury pattern. Patient recently underwent an echocardiogram and stress test results reviewed and noted above. Concerned that he may have balanced ischemia and therefore further cardiovascular testing was recommended.  We discussed coronary CTA versus left heart catheterization and patient would like to proceed with left heart catheterization. Start Toprol-XL 25 mg p.o. daily. Start nitroglycerin tablets on a as needed basis.  Medication profile discussed.  Patient is educated not to use erectile dysfunction medications as directed drug interactions can cause further comorbidities.  Patient verbalized understanding and provides verbal feedback The procedure of left heart catheterization with possible intervention was explained to the patient in detail.  The indication, alternatives, risks and benefits were reviewed.  Complications include but not limited to bleeding, infection, vascular injury, stroke, myocardial infection, arrhythmia, kidney injury requiring hemodialysis, radiation-related injury in the case of prolonged fluoroscopy use, emergency cardiac surgery, and death. The patient understands the risks of serious complication is 1-2 in 1000 with diagnostic cardiac cath and 1-2% or less with angioplasty/stenting.  The patient voices understanding and provides verbal feedback and wishes to proceed with coronary angiography with possible PCI. Educated on seeking medical attention sooner by going to the closest ER via EMS if the symptoms increase in intensity, frequency, duration, or has typical chest pain as discussed in the office.  Patient verbalized understanding.  Pure hypercholesterolemia: Check fasting lipid profile. Patient states that he has tried multiple statin therapies in the past and he has been intolerant to them. Start Zetia 10 mg p.o. daily  Benign essential hypertension: Continue antihypertensive medications. Low-salt diet recommended. Recommend a  goal systolic blood pressures between 120-130 mmHg Currently managed by primary care provider.  Erectile dysfunction: Currently uses Viagra on as needed basis. Patient states that the symptoms started approximately 1-1.5 years ago. Currently managed by primary care provider.  FINAL MEDICATION LIST END OF ENCOUNTER: Meds ordered this encounter  Medications  . metoprolol succinate (TOPROL XL) 25 MG 24 hr tablet    Sig: Take 1 tablet (25 mg total) by mouth daily.    Dispense:  90 tablet    Refill:  0  . nitroGLYCERIN (NITROSTAT) 0.4 MG SL tablet    Sig: Place 1 tablet (0.4 mg total) under the tongue every 5 (five) minutes as needed for chest pain. If you require more than two tablets five minutes apart go to the nearest ER via EMS.    Dispense:  30 tablet    Refill:  0  . ezetimibe (ZETIA) 10 MG tablet    Sig: Take 1 tablet (10 mg total) by mouth daily.    Dispense:  30 tablet    Refill:  0    There are no discontinued medications.  Current Outpatient Medications:  .  aspirin 81 MG chewable tablet, Chew by mouth daily., Disp: , Rfl:  .  cetirizine (ZYRTEC) 5 MG tablet, Take 5 mg by mouth daily., Disp: , Rfl:  .  Cholecalciferol (VITAMIN D) 2000 units CAPS, Take by mouth., Disp: , Rfl:  .  ezetimibe (ZETIA) 10 MG tablet, Take 1 tablet (10 mg total) by mouth daily., Disp: 30 tablet, Rfl: 0 .  hydrochlorothiazide (HYDRODIURIL) 25 MG tablet, Take 25 mg by mouth daily., Disp: , Rfl:  .  losartan (COZAAR) 50 MG tablet, Take 50 mg by mouth daily., Disp: , Rfl:  .  Magnesium 400 MG TABS, Take by mouth., Disp: , Rfl:  .  metoprolol succinate (TOPROL XL) 25 MG 24 hr tablet, Take 1 tablet (25 mg total) by mouth daily., Disp: 90 tablet, Rfl: 0 .  nitroGLYCERIN (NITROSTAT) 0.4 MG SL tablet, Place 1 tablet (0.4 mg total) under the tongue every 5 (five) minutes as needed for chest pain. If you require more than two tablets five minutes apart go to the nearest ER via EMS., Disp: 30 tablet, Rfl: 0 .   omalizumab (XOLAIR) 150 MG injection, Inject into the skin every 28 (twenty-eight) days., Disp: , Rfl:  .  Omega-3 Fatty Acids (OMEGA-3 PO), Take 2,000 mg by mouth., Disp: , Rfl:  .  OVER THE COUNTER MEDICATION, Cholest-OFF 300mg  po, Disp: , Rfl:  .  vitamin B-12 (CYANOCOBALAMIN) 1000 MCG tablet, Take 1,000 mcg by mouth daily., Disp: , Rfl:   Orders Placed This Encounter  Procedures  . SARS-COV-2 RNA,(COVID-19) QUAL NAAT  . Basic metabolic panel  . Magnesium  . Lipid Panel With LDL/HDL Ratio  . LDL cholesterol, direct  . CBC  . EKG 12-Lead    There are no Patient Instructions on file for this visit.   --Continue cardiac medications as reconciled in final medication list. --Return in about 22 days (around 01/14/2021) for Follow up, Chest pain. Or sooner if needed. --Continue follow-up with your primary care physician regarding the management of your other chronic comorbid conditions.  Patient's questions and concerns were addressed to his satisfaction. He voices understanding of the instructions provided during this encounter.   This note was created using a voice recognition software as a result there may be grammatical errors inadvertently enclosed that do not reflect the nature of this encounter. Every attempt is made to correct such errors.  01/16/2021, Tessa Lerner, [facility]  Pager: [phone] Office: [phone]

## 2020-12-19 NOTE — Progress Notes (Signed)
 Date:  12/23/2020   ID:  Cory Hampton, DOB 10/24/1955, MRN [id number]  PCP:  Wolters, Sharon, MD  Cardiologist:  Karon Heckendorn, DO, FACC (established care 12/23/2020) Former Cardiology Providers: Dr. Ganji   REASON FOR CONSULT: Chest pain   REQUESTING PHYSICIAN:  Dr. David Merrell [facility]  [address] [place], [place] [address].   Chief Complaint  Patient presents with  . Chest Pain  . Hypertension  . New Patient (Initial Visit)    HPI  Cory Hampton is a 65 y.o. male who presents to the office with a chief complaint of " chest pain." Patient's past medical history and cardiovascular risk factors include: hypertension, hyperlipidemia, ED, restless leg syndrome.   He is referred to the office at the request of Dr. David Merrell for evaluation of chest pain.  Patient states that he has been experiencing chest pain and bilateral arm pain since February 2022.  He is an avid walker and tries to walk 4 miles or up to 10,000 steps on a daily basis.  Since February he started noticing pain in his anterior chest wall and bilateral arms with effort related activities.  When he were to walk on a treadmill more than 3.5 mph the symptoms would surface and resolve after the cessation of activity.  He followed up with his provider at [facility] and underwent an echocardiogram and stress test as noted below.  Thereafter the symptoms subsided until recently he is experiencing chest pain which is dull/ache-like sensation and at times pressure, radiates to both arms, worse with effort related activities, resolving with rest.  Patient does have history of hypercholesterolemia.  However has been intolerant to statin therapy and therefore is not on statins as of now but currently takes omega-3's.  Patient is currently on aspirin 81 mg p.o. daily but no known CAD/CVA..  No complaints of cough in the last labs family history of premature coronary disease or sudden cardiac  death.  FUNCTIONAL STATUS: Walk 4 miles a day or 10,000 steps a day.    ALLERGIES: Allergies  Allergen Reactions  . Statins Other (See Comments)    Weakness, join aches    MEDICATION LIST PRIOR TO VISIT: Current Meds  Medication Sig  . aspirin 81 MG chewable tablet Chew by mouth daily.  . cetirizine (ZYRTEC) 5 MG tablet Take 5 mg by mouth daily.  . Cholecalciferol (VITAMIN D) 2000 units CAPS Take by mouth.  . ezetimibe (ZETIA) 10 MG tablet Take 1 tablet (10 mg total) by mouth daily.  . hydrochlorothiazide (HYDRODIURIL) 25 MG tablet Take 25 mg by mouth daily.  . losartan (COZAAR) 50 MG tablet Take 50 mg by mouth daily.  . Magnesium 400 MG TABS Take by mouth.  . metoprolol succinate (TOPROL XL) 25 MG 24 hr tablet Take 1 tablet (25 mg total) by mouth daily.  . nitroGLYCERIN (NITROSTAT) 0.4 MG SL tablet Place 1 tablet (0.4 mg total) under the tongue every 5 (five) minutes as needed for chest pain. If you require more than two tablets five minutes apart go to the nearest ER via EMS.  . omalizumab (XOLAIR) 150 MG injection Inject into the skin every 28 (twenty-eight) days.  . Omega-3 Fatty Acids (OMEGA-3 PO) Take 2,000 mg by mouth.  . OVER THE COUNTER MEDICATION Cholest-OFF 300mg po  . vitamin B-12 (CYANOCOBALAMIN) 1000 MCG tablet Take 1,000 mcg by mouth daily.     PAST MEDICAL HISTORY: Past Medical History:  Diagnosis Date  . Allergy   .   Erectile dysfunction   . Hyperlipidemia   . Hypertension   . Restless leg syndrome     PAST SURGICAL HISTORY: Past Surgical History:  Procedure Laterality Date  . COLONOSCOPY    . FOREIGN BODY REMOVAL ABDOMINAL     age 20  . WISDOM TOOTH EXTRACTION      FAMILY HISTORY: The patient family history includes Dementia in his father; Hypertension in his sister; Stroke in his father and mother.  SOCIAL HISTORY:  The patient  reports that he has never smoked. He has never used smokeless tobacco. He reports current alcohol use of about 4.0  standard drinks of alcohol per week. He reports that he does not use drugs.  REVIEW OF SYSTEMS: Review of Systems  Constitutional: Negative for chills and fever.  HENT: Negative for hoarse voice and nosebleeds.   Eyes: Negative for discharge, double vision and pain.  Cardiovascular: Positive for chest pain. Negative for claudication, dyspnea on exertion, leg swelling, near-syncope, orthopnea, palpitations, paroxysmal nocturnal dyspnea and syncope.  Respiratory: Negative for hemoptysis and shortness of breath.   Musculoskeletal: Negative for muscle cramps and myalgias.  Gastrointestinal: Negative for abdominal pain, constipation, diarrhea, hematemesis, hematochezia, melena, nausea and vomiting.  Neurological: Negative for dizziness and light-headedness.    PHYSICAL EXAM: Vitals with BMI 12/23/2020 11/12/2020 07/08/2017  Height 5\' 9"  5\' 9"  -  Weight 171 lbs 174 lbs -  BMI 25.24 25.68 -  Systolic 138 - 136  Diastolic 82 - 84  Pulse 80 - 77    CONSTITUTIONAL: Well-developed and well-nourished. No acute distress.  SKIN: Skin is warm and dry. No rash noted. No cyanosis. No pallor. No jaundice HEAD: Normocephalic and atraumatic.  EYES: No scleral icterus MOUTH/THROAT: Moist oral membranes.  NECK: No JVD present. No thyromegaly noted. No carotid bruits  LYMPHATIC: No visible cervical adenopathy.  CHEST Normal respiratory effort. No intercostal retractions  LUNGS: Clear to auscultation bilaterally.  No stridor. No wheezes. No rales.  CARDIOVASCULAR: Regular rate and rhythm, positive S1-S2, no murmurs rubs or gallops appreciated. ABDOMINAL: No apparent ascites.  EXTREMITIES: No peripheral edema  HEMATOLOGIC: No significant bruising NEUROLOGIC: Oriented to person, place, and time. Nonfocal. Normal muscle tone.  PSYCHIATRIC: Normal mood and affect. Normal behavior. Cooperative  CARDIAC DATABASE: EKG: 12/23/2020: Normal sinus rhythm, 85 bpm, normal axis, left atrial enlargement, without  underlying ischemia or injury pattern.   Echocardiogram: 11/06/2020: LVEF 55 to 60%, no regional wall motion abnormalities, normal diastolic function, trivial MR.  Stress Testing: 11/12/2020 MPI:  Nuclear stress EF: 63%.  The left ventricular ejection fraction is normal (55-65%).  There was no ST segment deviation noted during stress.  No T wave inversion was noted during stress.  Defect 1: There is a small defect of moderate severity present in the apex location.  Findings consistent with ischemia. However, there is normal wall motion. Cannot rule out apical thinning.  This is a low risk study  Heart Catheterization: None  LABORATORY DATA: No flowsheet data found.  No flowsheet data found.  Lipid Panel  No results found for: CHOL, TRIG, HDL, CHOLHDL, VLDL, LDLCALC, LDLDIRECT, LABVLDL  No components found for: NTPROBNP No results for input(s): PROBNP in the last 8760 hours. No results for input(s): TSH in the last 8760 hours.  BMP No results for input(s): NA, K, CL, CO2, GLUCOSE, BUN, CREATININE, CALCIUM, GFRNONAA, GFRAA in the last 8760 hours.  HEMOGLOBIN A1C No results found for: HGBA1C, MPG  IMPRESSION:    ICD-10-CM   1. Angina pectoris (HCC)  I20.9 EKG 12-Lead    Basic metabolic panel    Magnesium    Lipid Panel With LDL/HDL Ratio    LDL cholesterol, direct    CBC    metoprolol succinate (TOPROL XL) 25 MG 24 hr tablet    nitroGLYCERIN (NITROSTAT) 0.4 MG SL tablet    SARS-COV-2 RNA,(COVID-19) QUAL NAAT  2. Hypertension, essential  I10   3. Pure hypercholesterolemia  E78.00 ezetimibe (ZETIA) 10 MG tablet  4. Erectile dysfunction, unspecified erectile dysfunction type  N52.9      RECOMMENDATIONS: Dvontae Ruan is a 65 y.o. male whose past medical history and cardiac risk factors include: hypertension, hyperlipidemia, ED, restless leg syndrome.   Angina pectoris: Patient symptoms are classic for anginal discomfort. EKG shows normal sinus rhythm  without underlying ischemic injury pattern. Patient recently underwent an echocardiogram and stress test results reviewed and noted above. Concerned that he may have balanced ischemia and therefore further cardiovascular testing was recommended.  We discussed coronary CTA versus left heart catheterization and patient would like to proceed with left heart catheterization. Start Toprol-XL 25 mg p.o. daily. Start nitroglycerin tablets on a as needed basis.  Medication profile discussed.  Patient is educated not to use erectile dysfunction medications as directed drug interactions can cause further comorbidities.  Patient verbalized understanding and provides verbal feedback The procedure of left heart catheterization with possible intervention was explained to the patient in detail.  The indication, alternatives, risks and benefits were reviewed.  Complications include but not limited to bleeding, infection, vascular injury, stroke, myocardial infection, arrhythmia, kidney injury requiring hemodialysis, radiation-related injury in the case of prolonged fluoroscopy use, emergency cardiac surgery, and death. The patient understands the risks of serious complication is 1-2 in 1000 with diagnostic cardiac cath and 1-2% or less with angioplasty/stenting.  The patient voices understanding and provides verbal feedback and wishes to proceed with coronary angiography with possible PCI. Educated on seeking medical attention sooner by going to the closest ER via EMS if the symptoms increase in intensity, frequency, duration, or has typical chest pain as discussed in the office.  Patient verbalized understanding.  Pure hypercholesterolemia: Check fasting lipid profile. Patient states that he has tried multiple statin therapies in the past and he has been intolerant to them. Start Zetia 10 mg p.o. daily  Benign essential hypertension: Continue antihypertensive medications. Low-salt diet recommended. Recommend a  goal systolic blood pressures between 120-130 mmHg Currently managed by primary care provider.  Erectile dysfunction: Currently uses Viagra on as needed basis. Patient states that the symptoms started approximately 1-1.5 years ago. Currently managed by primary care provider.  FINAL MEDICATION LIST END OF ENCOUNTER: Meds ordered this encounter  Medications  . metoprolol succinate (TOPROL XL) 25 MG 24 hr tablet    Sig: Take 1 tablet (25 mg total) by mouth daily.    Dispense:  90 tablet    Refill:  0  . nitroGLYCERIN (NITROSTAT) 0.4 MG SL tablet    Sig: Place 1 tablet (0.4 mg total) under the tongue every 5 (five) minutes as needed for chest pain. If you require more than two tablets five minutes apart go to the nearest ER via EMS.    Dispense:  30 tablet    Refill:  0  . ezetimibe (ZETIA) 10 MG tablet    Sig: Take 1 tablet (10 mg total) by mouth daily.    Dispense:  30 tablet    Refill:  0    There are no discontinued medications.  Current Outpatient Medications:  .  aspirin 81 MG chewable tablet, Chew by mouth daily., Disp: , Rfl:  .  cetirizine (ZYRTEC) 5 MG tablet, Take 5 mg by mouth daily., Disp: , Rfl:  .  Cholecalciferol (VITAMIN D) 2000 units CAPS, Take by mouth., Disp: , Rfl:  .  ezetimibe (ZETIA) 10 MG tablet, Take 1 tablet (10 mg total) by mouth daily., Disp: 30 tablet, Rfl: 0 .  hydrochlorothiazide (HYDRODIURIL) 25 MG tablet, Take 25 mg by mouth daily., Disp: , Rfl:  .  losartan (COZAAR) 50 MG tablet, Take 50 mg by mouth daily., Disp: , Rfl:  .  Magnesium 400 MG TABS, Take by mouth., Disp: , Rfl:  .  metoprolol succinate (TOPROL XL) 25 MG 24 hr tablet, Take 1 tablet (25 mg total) by mouth daily., Disp: 90 tablet, Rfl: 0 .  nitroGLYCERIN (NITROSTAT) 0.4 MG SL tablet, Place 1 tablet (0.4 mg total) under the tongue every 5 (five) minutes as needed for chest pain. If you require more than two tablets five minutes apart go to the nearest ER via EMS., Disp: 30 tablet, Rfl: 0 .   omalizumab (XOLAIR) 150 MG injection, Inject into the skin every 28 (twenty-eight) days., Disp: , Rfl:  .  Omega-3 Fatty Acids (OMEGA-3 PO), Take 2,000 mg by mouth., Disp: , Rfl:  .  OVER THE COUNTER MEDICATION, Cholest-OFF 300mg  po, Disp: , Rfl:  .  vitamin B-12 (CYANOCOBALAMIN) 1000 MCG tablet, Take 1,000 mcg by mouth daily., Disp: , Rfl:   Orders Placed This Encounter  Procedures  . SARS-COV-2 RNA,(COVID-19) QUAL NAAT  . Basic metabolic panel  . Magnesium  . Lipid Panel With LDL/HDL Ratio  . LDL cholesterol, direct  . CBC  . EKG 12-Lead    There are no Patient Instructions on file for this visit.   --Continue cardiac medications as reconciled in final medication list. --Return in about 22 days (around 01/14/2021) for Follow up, Chest pain. Or sooner if needed. --Continue follow-up with your primary care physician regarding the management of your other chronic comorbid conditions.  Patient's questions and concerns were addressed to his satisfaction. He voices understanding of the instructions provided during this encounter.   This note was created using a voice recognition software as a result there may be grammatical errors inadvertently enclosed that do not reflect the nature of this encounter. Every attempt is made to correct such errors.  01/16/2021, Tessa Lerner, [facility]  Pager: [phone] Office: [phone]

## 2020-12-23 ENCOUNTER — Other Ambulatory Visit: Payer: Self-pay

## 2020-12-23 ENCOUNTER — Ambulatory Visit: Payer: 59 | Admitting: Cardiology

## 2020-12-23 ENCOUNTER — Encounter: Payer: Self-pay | Admitting: Cardiology

## 2020-12-23 VITALS — BP 138/82 | HR 80 | Temp 98.0°F | Resp 16 | Ht 69.0 in | Wt 171.0 lb

## 2020-12-23 DIAGNOSIS — I10 Essential (primary) hypertension: Secondary | ICD-10-CM

## 2020-12-23 DIAGNOSIS — E7800 Pure hypercholesterolemia, unspecified: Secondary | ICD-10-CM

## 2020-12-23 DIAGNOSIS — N529 Male erectile dysfunction, unspecified: Secondary | ICD-10-CM

## 2020-12-23 DIAGNOSIS — I209 Angina pectoris, unspecified: Secondary | ICD-10-CM

## 2020-12-23 MED ORDER — NITROGLYCERIN 0.4 MG SL SUBL: 0.4000 mg | SUBLINGUAL_TABLET | SUBLINGUAL | 0 refills | Status: DC | PRN

## 2020-12-23 MED ORDER — METOPROLOL SUCCINATE ER 25 MG PO TB24
25.0000 mg | ORAL_TABLET | Freq: Every day | ORAL | 0 refills | Status: DC
Start: 2020-12-23 — End: 2021-03-17

## 2020-12-23 MED ORDER — EZETIMIBE 10 MG PO TABS
10.0000 mg | ORAL_TABLET | Freq: Every day | ORAL | 0 refills | Status: DC
Start: 2020-12-23 — End: 2021-01-15

## 2020-12-24 NOTE — Telephone Encounter (Signed)
External labs: Collected: 12/19/2019 provided by the patient Creatinine 1.01mg /dL. eGFR: 79 mL/min per 1.73 m Hemoglobin 15.9 g/dL, hematocrit 45.4% Lipid profile: Total cholesterol 199, triglycerides 124, HDL 40, LDL 134, non-HDL 137 TSH: 2.59  Thanks for the update, we will use it for comparison purposes.

## 2020-12-25 NOTE — Telephone Encounter (Signed)
Please inform the front desk regarding this so they can update his chart.

## 2020-12-27 ENCOUNTER — Other Ambulatory Visit (HOSPITAL_COMMUNITY)
Admission: RE | Admit: 2020-12-27 | Discharge: 2020-12-27 | Disposition: A | Discharge status: Home / Self Care | Payer: 59 | Source: Ambulatory Visit | Attending: Cardiology | Admitting: Cardiology

## 2020-12-27 DIAGNOSIS — Z01812 Encounter for preprocedural laboratory examination: Secondary | ICD-10-CM | POA: Insufficient documentation

## 2020-12-27 DIAGNOSIS — Z20822 Contact with and (suspected) exposure to covid-19: Secondary | ICD-10-CM | POA: Diagnosis not present

## 2020-12-27 LAB — SARS CORONAVIRUS 2 (TAT 6-24 HRS): SARS Coronavirus 2: NEGATIVE

## 2020-12-30 DIAGNOSIS — R9439 Abnormal result of other cardiovascular function study: Secondary | ICD-10-CM | POA: Diagnosis present

## 2020-12-31 ENCOUNTER — Encounter (HOSPITAL_COMMUNITY): Payer: Self-pay | Admitting: Cardiology

## 2020-12-31 ENCOUNTER — Ambulatory Visit (HOSPITAL_COMMUNITY)
Admission: RE | Disposition: A | Discharge status: Home / Self Care | Payer: Self-pay | Source: Home / Self Care | Attending: Cardiology

## 2020-12-31 ENCOUNTER — Ambulatory Visit (HOSPITAL_COMMUNITY)
Admission: RE | Admit: 2020-12-31 | Discharge: 2021-01-01 | Disposition: A | Discharge status: Home / Self Care | Payer: 59 | Attending: Cardiology | Admitting: Cardiology

## 2020-12-31 ENCOUNTER — Other Ambulatory Visit: Payer: Self-pay

## 2020-12-31 DIAGNOSIS — Z9861 Coronary angioplasty status: Secondary | ICD-10-CM

## 2020-12-31 DIAGNOSIS — N529 Male erectile dysfunction, unspecified: Secondary | ICD-10-CM | POA: Diagnosis not present

## 2020-12-31 DIAGNOSIS — I2582 Chronic total occlusion of coronary artery: Secondary | ICD-10-CM | POA: Diagnosis not present

## 2020-12-31 DIAGNOSIS — E7800 Pure hypercholesterolemia, unspecified: Secondary | ICD-10-CM | POA: Diagnosis not present

## 2020-12-31 DIAGNOSIS — I25119 Atherosclerotic heart disease of native coronary artery with unspecified angina pectoris: Secondary | ICD-10-CM | POA: Diagnosis present

## 2020-12-31 DIAGNOSIS — Z7982 Long term (current) use of aspirin: Secondary | ICD-10-CM | POA: Diagnosis not present

## 2020-12-31 DIAGNOSIS — E785 Hyperlipidemia, unspecified: Secondary | ICD-10-CM | POA: Diagnosis not present

## 2020-12-31 DIAGNOSIS — Z79899 Other long term (current) drug therapy: Secondary | ICD-10-CM | POA: Diagnosis not present

## 2020-12-31 DIAGNOSIS — R072 Precordial pain: Secondary | ICD-10-CM | POA: Diagnosis present

## 2020-12-31 DIAGNOSIS — R9439 Abnormal result of other cardiovascular function study: Secondary | ICD-10-CM | POA: Diagnosis present

## 2020-12-31 DIAGNOSIS — I10 Essential (primary) hypertension: Secondary | ICD-10-CM | POA: Diagnosis not present

## 2020-12-31 DIAGNOSIS — Z006 Encounter for examination for normal comparison and control in clinical research program: Secondary | ICD-10-CM

## 2020-12-31 DIAGNOSIS — I240 Acute coronary thrombosis not resulting in myocardial infarction: Secondary | ICD-10-CM

## 2020-12-31 DIAGNOSIS — G2581 Restless legs syndrome: Secondary | ICD-10-CM | POA: Insufficient documentation

## 2020-12-31 DIAGNOSIS — Z955 Presence of coronary angioplasty implant and graft: Secondary | ICD-10-CM

## 2020-12-31 DIAGNOSIS — I25118 Atherosclerotic heart disease of native coronary artery with other forms of angina pectoris: Secondary | ICD-10-CM

## 2020-12-31 HISTORY — PX: INTRAVASCULAR PRESSURE WIRE/FFR STUDY: CATH118243

## 2020-12-31 HISTORY — PX: CORONARY CTO INTERVENTION: CATH118236

## 2020-12-31 HISTORY — PX: CORONARY STENT INTERVENTION: CATH118234

## 2020-12-31 HISTORY — PX: CORONARY PRESSURE/FFR STUDY: CATH118243

## 2020-12-31 HISTORY — PX: LEFT HEART CATH AND CORONARY ANGIOGRAPHY: CATH118249

## 2020-12-31 LAB — POCT ACTIVATED CLOTTING TIME
Activated Clotting Time: 279 seconds
Activated Clotting Time: 255 seconds

## 2020-12-31 SURGERY — LEFT HEART CATH AND CORONARY ANGIOGRAPHY
Anesthesia: LOCAL

## 2020-12-31 MED ORDER — CLOPIDOGREL BISULFATE 300 MG PO TABS
ORAL_TABLET | ORAL | Status: AC
  Filled 2020-12-31: qty 1

## 2020-12-31 MED ORDER — METOPROLOL SUCCINATE ER 25 MG PO TB24
25.0000 mg | ORAL_TABLET | ORAL | Status: DC
  Administered 2021-01-01: 25 mg via ORAL
  Filled 2020-12-31: qty 1

## 2020-12-31 MED ORDER — CLOPIDOGREL BISULFATE 75 MG PO TABS
75.0000 mg | ORAL_TABLET | Freq: Every day | ORAL | Status: DC
  Administered 2021-01-01: 75 mg via ORAL
  Filled 2020-12-31: qty 1

## 2020-12-31 MED ORDER — HEPARIN (PORCINE) IN NACL 1000-0.9 UT/500ML-% IV SOLN
INTRAVENOUS | Status: AC
  Filled 2020-12-31: qty 1500

## 2020-12-31 MED ORDER — ASPIRIN 81 MG PO CHEW
81.0000 mg | CHEWABLE_TABLET | ORAL | Status: AC
  Administered 2020-12-31: 81 mg via ORAL
  Filled 2020-12-31: qty 1

## 2020-12-31 MED ORDER — SODIUM CHLORIDE 0.9 % WEIGHT BASED INFUSION: 1.0000 mL/kg/h | INTRAVENOUS | Status: DC

## 2020-12-31 MED ORDER — MIDAZOLAM HCL 2 MG/2ML IJ SOLN
INTRAMUSCULAR | Status: DC | PRN
  Administered 2020-12-31: 2 mg via INTRAVENOUS

## 2020-12-31 MED ORDER — FENTANYL CITRATE (PF) 100 MCG/2ML IJ SOLN
INTRAMUSCULAR | Status: AC
  Filled 2020-12-31: qty 2

## 2020-12-31 MED ORDER — LOSARTAN POTASSIUM 50 MG PO TABS
50.0000 mg | ORAL_TABLET | Freq: Every day | ORAL | Status: DC
  Administered 2020-12-31: 50 mg via ORAL
  Filled 2020-12-31: qty 1

## 2020-12-31 MED ORDER — SODIUM CHLORIDE 0.9 % IV SOLN: 250.0000 mL | INTRAVENOUS | Status: DC | PRN

## 2020-12-31 MED ORDER — SODIUM CHLORIDE 0.9 % WEIGHT BASED INFUSION
3.0000 mL/kg/h | INTRAVENOUS | Status: DC
  Administered 2020-12-31: 3 mL/kg/h via INTRAVENOUS

## 2020-12-31 MED ORDER — HEPARIN (PORCINE) IN NACL 1000-0.9 UT/500ML-% IV SOLN
INTRAVENOUS | Status: DC | PRN
  Administered 2020-12-31 (×2): 500 mL

## 2020-12-31 MED ORDER — LIDOCAINE HCL (PF) 1 % IJ SOLN
INTRAMUSCULAR | Status: DC | PRN
  Administered 2020-12-31: 2 mL

## 2020-12-31 MED ORDER — SODIUM CHLORIDE 0.9 % WEIGHT BASED INFUSION
1.0000 mL/kg/h | INTRAVENOUS | Status: AC
  Administered 2020-12-31: 1 mL/kg/h via INTRAVENOUS

## 2020-12-31 MED ORDER — NITROGLYCERIN 1 MG/10 ML FOR IR/CATH LAB
INTRA_ARTERIAL | Status: DC | PRN
  Administered 2020-12-31: 200 ug via INTRACORONARY

## 2020-12-31 MED ORDER — ONDANSETRON HCL 4 MG/2ML IJ SOLN: 4.0000 mg | Freq: Four times a day (QID) | INTRAMUSCULAR | Status: DC | PRN

## 2020-12-31 MED ORDER — HYDRALAZINE HCL 20 MG/ML IJ SOLN: 5.0000 mg | INTRAMUSCULAR | Status: AC | PRN

## 2020-12-31 MED ORDER — SODIUM CHLORIDE 0.9% FLUSH
3.0000 mL | Freq: Two times a day (BID) | INTRAVENOUS | Status: DC
  Administered 2021-01-01: 3 mL via INTRAVENOUS

## 2020-12-31 MED ORDER — ASPIRIN EC 81 MG PO TBEC: 81.0000 mg | DELAYED_RELEASE_TABLET | Freq: Every day | ORAL | Status: DC

## 2020-12-31 MED ORDER — MIDAZOLAM HCL 2 MG/2ML IJ SOLN
INTRAMUSCULAR | Status: AC
  Filled 2020-12-31: qty 2

## 2020-12-31 MED ORDER — ACETAMINOPHEN 325 MG PO TABS
650.0000 mg | ORAL_TABLET | ORAL | Status: DC | PRN
  Administered 2020-12-31 – 2021-01-01 (×2): 650 mg via ORAL
  Filled 2020-12-31 (×2): qty 2

## 2020-12-31 MED ORDER — NITROGLYCERIN 1 MG/10 ML FOR IR/CATH LAB
INTRA_ARTERIAL | Status: AC
  Filled 2020-12-31: qty 10

## 2020-12-31 MED ORDER — SIMVASTATIN 20 MG PO TABS
20.0000 mg | ORAL_TABLET | Freq: Every day | ORAL | Status: DC
  Administered 2020-12-31: 20 mg via ORAL
  Filled 2020-12-31: qty 1

## 2020-12-31 MED ORDER — NITROGLYCERIN 0.4 MG SL SUBL: 0.4000 mg | SUBLINGUAL_TABLET | SUBLINGUAL | Status: DC | PRN

## 2020-12-31 MED ORDER — CLOPIDOGREL BISULFATE 300 MG PO TABS
ORAL_TABLET | ORAL | Status: DC | PRN
  Administered 2020-12-31: 600 mg via ORAL

## 2020-12-31 MED ORDER — LIDOCAINE HCL (PF) 1 % IJ SOLN
INTRAMUSCULAR | Status: AC
  Filled 2020-12-31: qty 30

## 2020-12-31 MED ORDER — HEPARIN SODIUM (PORCINE) 1000 UNIT/ML IJ SOLN
INTRAMUSCULAR | Status: AC
  Filled 2020-12-31: qty 1

## 2020-12-31 MED ORDER — SODIUM CHLORIDE 0.9% FLUSH: 3.0000 mL | INTRAVENOUS | Status: DC | PRN

## 2020-12-31 MED ORDER — SODIUM CHLORIDE 0.9% FLUSH
3.0000 mL | Freq: Two times a day (BID) | INTRAVENOUS | Status: DC
  Administered 2020-12-31: 3 mL via INTRAVENOUS

## 2020-12-31 MED ORDER — HEPARIN SODIUM (PORCINE) 1000 UNIT/ML IJ SOLN
INTRAMUSCULAR | Status: DC | PRN
  Administered 2020-12-31 (×2): 5000 [IU] via INTRAVENOUS
  Administered 2020-12-31: 3000 [IU] via INTRAVENOUS

## 2020-12-31 MED ORDER — FENTANYL CITRATE (PF) 100 MCG/2ML IJ SOLN
INTRAMUSCULAR | Status: DC | PRN
  Administered 2020-12-31: 50 ug via INTRAVENOUS

## 2020-12-31 MED ORDER — EZETIMIBE 10 MG PO TABS
10.0000 mg | ORAL_TABLET | Freq: Every day | ORAL | Status: DC
  Administered 2020-12-31: 10 mg via ORAL
  Filled 2020-12-31: qty 1

## 2020-12-31 MED ORDER — VERAPAMIL HCL 2.5 MG/ML IV SOLN
INTRAVENOUS | Status: AC
  Filled 2020-12-31: qty 2

## 2020-12-31 MED ORDER — IOHEXOL 350 MG/ML SOLN
INTRAVENOUS | Status: DC | PRN
  Administered 2020-12-31: 150 mL

## 2020-12-31 SURGICAL SUPPLY — 24 items
BALLN EUPHORA RX 2.5X25 (BALLOONS) ×2
BALLN MINITREK OTW 1.5X6 (BALLOONS) ×2
BALLN SAPPHIRE ~~LOC~~ 2.75X15 (BALLOONS) ×1 IMPLANT
BALLOON EUPHORA RX 2.5X25 (BALLOONS) IMPLANT
BALLOON MINITREK OTW 1.5X6 (BALLOONS) IMPLANT
CATH INFINITI 5FR JL4 (CATHETERS) ×1 IMPLANT
CATH OPTITORQUE TIG 4.0 5F (CATHETERS) ×1 IMPLANT
CATH VISTA GUIDE 6FR AL1 (CATHETERS) ×1 IMPLANT
DEVICE RAD COMP TR BAND LRG (VASCULAR PRODUCTS) ×1 IMPLANT
GLIDESHEATH SLEND A-KIT 6F 22G (SHEATH) ×1 IMPLANT
GUIDEWIRE INQWIRE 1.5J.035X260 (WIRE) IMPLANT
GUIDEWIRE PRESSURE X 175 (WIRE) ×1 IMPLANT
INQWIRE 1.5J .035X260CM (WIRE) ×2
KIT ENCORE 26 ADVANTAGE (KITS) ×2 IMPLANT
KIT HEART LEFT (KITS) ×2 IMPLANT
PACK CARDIAC CATHETERIZATION (CUSTOM PROCEDURE TRAY) ×2 IMPLANT
STENT SYNERGY XD 2.50X28 (Permanent Stent) IMPLANT
SYNERGY XD 2.50X28 (Permanent Stent) ×2 IMPLANT
TRANSDUCER W/STOPCOCK (MISCELLANEOUS) ×2 IMPLANT
TUBING CIL FLEX 10 FLL-RA (TUBING) ×2 IMPLANT
WIRE COUGAR XT STRL 190CM (WIRE) ×1 IMPLANT
WIRE FIGHTER CROSSING 190CM (WIRE) ×1 IMPLANT
WIRE HI TORQ WHISPER MS 190CM (WIRE) ×1 IMPLANT
WIRE RUNTHROUGH .014X180CM (WIRE) ×1 IMPLANT

## 2020-12-31 NOTE — Research (Signed)
IDENTIFY Informed Consent                  Subject Name: Cory Hampton   Subject met inclusion and exclusion criteria.  The informed consent form, study requirements and expectations were reviewed with the subject and questions and concerns were addressed prior to the signing of the consent form.  The subject verbalized understanding of the trial requirements.  The subject agreed to participate in the IDENTIFY trial and signed the informed consent at 10:55AM on 12/31/20.  The informed consent was obtained prior to performance of any protocol-specific procedures for the subject.  A copy of the signed informed consent was given to the subject and a copy was placed in the subject's medical record.   Meade Maw , [employer]

## 2020-12-31 NOTE — Plan of Care (Signed)

## 2020-12-31 NOTE — Interval H&P Note (Signed)
History and Physical Interval Note:  12/31/2020 3:50 PM  Cory Hampton  has presented today for surgery, with the diagnosis of angina - hp.  The various methods of treatment have been discussed with the patient and family. After consideration of risks, benefits and other options for treatment, the patient has consented to  Procedure(s): LEFT HEART CATH AND CORONARY ANGIOGRAPHY (N/A) and possible angioplasty as a surgical intervention.  The patient's history has been reviewed, patient examined, no change in status, stable for surgery.  I have reviewed the patient's chart and labs.  Questions were answered to the patient's satisfaction.   Cath Lab Visit (complete for each Cath Lab visit)  Clinical Evaluation Leading to the Procedure:   ACS: No.  Non-ACS:    Anginal Classification: CCS III  Anti-ischemic medical therapy: Minimal Therapy (1 class of medications)  Non-Invasive Test Results: Low-risk stress test findings: cardiac mortality <1%/year  Prior CABG: No previous CABG   Yates Decamp

## 2021-01-01 ENCOUNTER — Encounter (HOSPITAL_COMMUNITY): Payer: Self-pay | Admitting: Cardiology

## 2021-01-01 ENCOUNTER — Telehealth: Payer: Self-pay

## 2021-01-01 ENCOUNTER — Other Ambulatory Visit (HOSPITAL_COMMUNITY): Payer: Self-pay

## 2021-01-01 DIAGNOSIS — I10 Essential (primary) hypertension: Secondary | ICD-10-CM | POA: Diagnosis not present

## 2021-01-01 DIAGNOSIS — I25119 Atherosclerotic heart disease of native coronary artery with unspecified angina pectoris: Secondary | ICD-10-CM | POA: Diagnosis not present

## 2021-01-01 DIAGNOSIS — E7800 Pure hypercholesterolemia, unspecified: Secondary | ICD-10-CM | POA: Diagnosis not present

## 2021-01-01 DIAGNOSIS — I2582 Chronic total occlusion of coronary artery: Secondary | ICD-10-CM | POA: Diagnosis not present

## 2021-01-01 LAB — CBC
HCT: 42.8 % (ref 39.0–52.0)
Hemoglobin: 14.4 g/dL (ref 13.0–17.0)
MCH: 30.5 pg (ref 26.0–34.0)
MCHC: 33.6 g/dL (ref 30.0–36.0)
MCV: 90.7 fL (ref 80.0–100.0)
Platelets: 247 10*3/uL (ref 150–400)
RBC: 4.72 MIL/uL (ref 4.22–5.81)
RDW: 12.3 % (ref 11.5–15.5)
WBC: 8.2 10*3/uL (ref 4.0–10.5)
nRBC: 0 % (ref 0.0–0.2)

## 2021-01-01 LAB — BASIC METABOLIC PANEL
Anion gap: 8 (ref 5–15)
BUN: 20 mg/dL (ref 8–23)
CO2: 28 mmol/L (ref 22–32)
Calcium: 9.2 mg/dL (ref 8.9–10.3)
Chloride: 102 mmol/L (ref 98–111)
Creatinine, Ser: 0.95 mg/dL (ref 0.61–1.24)
GFR, Estimated: >60 mL/min (ref >60)
Glucose, Bld: 104 mg/dL — ABNORMAL HIGH (ref 70–99)
Potassium: 3.3 mmol/L — ABNORMAL LOW (ref 3.5–5.1)
Sodium: 138 mmol/L (ref 135–145)

## 2021-01-01 MED ORDER — CLOPIDOGREL BISULFATE 75 MG PO TABS
75.0000 mg | ORAL_TABLET | Freq: Every day | ORAL | 2 refills | Status: AC
  Filled 2021-01-01: qty 30, 30d supply, fill #0

## 2021-01-01 MED ORDER — SIMVASTATIN 20 MG PO TABS
20.0000 mg | ORAL_TABLET | Freq: Every day | ORAL | 0 refills | Status: DC
  Filled 2021-01-01: qty 30, 30d supply, fill #0

## 2021-01-01 MED ORDER — POTASSIUM CHLORIDE CRYS ER 20 MEQ PO TBCR
40.0000 meq | EXTENDED_RELEASE_TABLET | Freq: Every day | ORAL | Status: DC
  Administered 2021-01-01: 40 meq via ORAL

## 2021-01-01 MED FILL — Verapamil HCl IV Soln 2.5 MG/ML: INTRAVENOUS | Qty: 2 | Status: AC

## 2021-01-01 MED FILL — Heparin Sod (Porcine)-NaCl IV Soln 1000 Unit/500ML-0.9%: INTRAVENOUS | Qty: 500 | Status: AC

## 2021-01-01 NOTE — Progress Notes (Signed)
Patient left unit alert and ambulating with no distress noted accompanied by Lanora Manis, NT for discharge.  Wife driving patient home.

## 2021-01-01 NOTE — Progress Notes (Signed)
Pt is alert and oriented. Discharge instructions/ AVS given to pt. 

## 2021-01-01 NOTE — Progress Notes (Signed)
CARDIAC REHAB PHASE I   PRE:  Rate/Rhythm: 91 SR  BP:  Supine: 116/71  Sitting:   Standing:    SaO2:   MODE:  Ambulation: 1300 ft   POST:  Rate/Rhythm: 94 SR  BP:  Supine:   Sitting: 138/86  Standing:    SaO2: 98%RA 0756-0900 Pt walked 1300 ft on RA with steady gait and no CP. Tolerated well. Education completed with pt who voiced understanding. Stressed importance of plavix with stent. Reviewed NTG use, walking for ex, heart healthy food choices and CRP 2. Referred to [place]. Pt very active and interested in program.   Luetta Nutting, RN BSN  01/01/2021 8:55 AM

## 2021-01-01 NOTE — Plan of Care (Signed)
  Problem: Health Behavior/Discharge Planning: Goal: Ability to manage health-related needs will improve Outcome: Progressing   Problem: Clinical Measurements: Goal: Ability to maintain clinical measurements within normal limits will improve Outcome: Completed/Met Goal: Respiratory complications will improve Outcome: Completed/Met Goal: Cardiovascular complication will be avoided Outcome: Completed/Met   Problem: Nutrition: Goal: Adequate nutrition will be maintained Outcome: Completed/Met   Problem: Coping: Goal: Level of anxiety will decrease Outcome: Completed/Met   Problem: Elimination: Goal: Will not experience complications related to urinary retention Outcome: Completed/Met

## 2021-01-01 NOTE — Telephone Encounter (Signed)
Location of hospitalization: [facility] Reason for hospitalization: Scheduled cath, stent placed Date of discharge: 01/01/2021  Date of first communication with patient: today Person contacting patient: Erby Pian, CMA Current symptoms: none Do you understand why you were in the Hospital: Yes Questions regarding discharge instructions: None Where were you discharged to: Home Medications reviewed: Yes Allergies reviewed: Yes Dietary changes reviewed: Yes. Discussed low fat and low salt diet.  Referals reviewed: NA Activities of Daily Living: Able to with mild limitations Any transportation issues/concerns: None Any patient concerns: None Confirmed importance & date/time of Follow up appt: Yes Confirmed with patient if condition begins to worsen call. Pt was given the office number and encouraged to call back with questions or concerns: Yes

## 2021-01-01 NOTE — Discharge Summary (Signed)
Physician Discharge Summary  Patient ID: Cory Hampton MRN: [id number] DOB/AGE: 1956/01/30 65 y.o.  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 Hyperlipidemia. Statin intolerance. Erectile dysfunction. Restless leg syndrome   Hospital Course:   65 y.o. Caucasian male  with past medical history and cardiovascular risk factors include: hypertension, hyperlipidemia, ED, restless leg syndrome.   Patient states that he has been experiencing chest pain and bilateral arm pain since February 2022.  He is an avid walker and tries to walk 4 miles or up to 10,000 steps on a daily basis.  Since February he started noticing pain in his anterior chest wall and bilateral arms with effort related activities.  When he were to walk on a treadmill more than 3.5 mph the symptoms would surface and resolve after the cessation of activity.  Patient had undergone an echocardiogram and myocardial perfusion testing earlier this year were noted to be overall unremarkable.  Shared decision was to proceed with left heart catheterization to evaluate for coronary artery disease as balanced ischemia cannot be ruled out.   Discharge Exam: Today's Vitals   12/31/20 2115 01/01/21 0742 01/01/21 0830 01/01/21 0850  BP:  116/71 138/86   Pulse:  92  96  Resp:  20 18   Temp:  97.7 F (36.5 C)    TempSrc:  Axillary    SpO2:   99%   Weight:      Height:      PainSc: 2   0-No pain    Body mass index is 25.1 kg/m.  PHYSICAL EXAM: Vitals with BMI 01/01/2021 01/01/2021 01/01/2021  Height - - -  Weight - - -  BMI - - -  Systolic - 138 116  Diastolic - 86 71  Pulse 96 - 92    CONSTITUTIONAL: Well-developed and well-nourished. No acute distress.  SKIN: Skin  is warm and dry. No rash noted. No cyanosis. No pallor. No jaundice HEAD: Normocephalic and atraumatic.  EYES: No scleral icterus MOUTH/THROAT: Moist oral membranes.  NECK: No JVD present. No thyromegaly noted. No carotid bruits  LYMPHATIC: No visible cervical adenopathy.  CHEST Normal respiratory effort. No intercostal retractions  LUNGS: Clear to auscultation bilaterally.  No stridor. No wheezes. No rales.  CARDIOVASCULAR: Regular rate and rhythm, positive S1-S2, no murmurs rubs or gallops appreciated. ABDOMINAL: No apparent ascites.  EXTREMITIES: No peripheral edema.  Right radial artery site well-healed, no hematoma, no bruit.  2+ radial pulse right radial site. HEMATOLOGIC: No significant bruising NEUROLOGIC: Oriented to person, place, and time. Nonfocal. Normal muscle tone.  PSYCHIATRIC: Normal mood and affect. Normal behavior. Cooperative   Impression and recommendations:  Impression: Angina pectoris Chronic total occlusion RCA -now status post PCI with TIMI-3 flow Established coronary artery disease status post angioplasty and stenting without angina  Recommendations: Patient continues to have chest pain consistent with angina pectoris despite having a low risk nuclear stress test in March 2022.  Concern was possibly he has balanced ischemia due to collateral flow but due to worsening exertional symptoms recommendations were made to proceed with left heart catheterization with possible intervention.  As expected patient did have collaterals from left to right circulation in addition proximal CTO of the RCA was noted on angiography.  He successfully underwent angioplasty and stenting with TIMI-3 flow.  Patient has residual coronary  artery disease within the other native vessels and medical management as recommended at this time.  This morning he is free of angina pectoris and wanting to go home.  Educated on the importance of improving his modifiable cardiovascular risk factors.   He has been intolerant to statin therapy in the past.  However knowing that he has multivessel CAD recommended at least low-dose statin for now along with Zetia until he can be approved for PCSK9 inhibitors as outpatient.  Patient is anginal free.  EKG today shows normal sinus rhythm without underlying injury pattern.  Recommend dual antiplatelet therapy for at least 6 months.  [facility]  It will be delivered prior to discharge  Cardiac rehab referral submitted.  Recommend that he be seen by cardiac rehab prior to discharge.  I will see him back in the office on 01/15/2021.  He is recommended to bring in his medications at that time.  Significant Diagnostic Studies:  EKG 01/01/2021: Normal sinus rhythm, 81 bpm, normal axis, without underlying ischemia or injury pattern.  Left Heart Catheterization and PCI CTO RCA, RFR to Mid LAD 12/31/20:  LV: Low LVEDP at 6 mmHg.  There was no pressure gradient across the aortic valve. LM: It is mildly diseased, mild calcification evident, mid segment has a 20% stenosis. LAD: Diffusely diseased, proximal to mid segment has mild to moderate amount of calcification.  There are multiple tandem 30 to 60% stenosis.  D1 and D2 are small to moderate-sized. LAD gives contralateral collaterals to the RCA. RFR to proximal and mid LAD = 0.84, at most equivocal. CX: Very minimal disease.  Large OM1. RCA: CTO proximal segment.  Mild to moderate amount of calcification evident in the RCA in the midsegment.  Intervention data: Successful CTO PCI with implantation of 2.5 x 28 mm Synergy DES which was postdilated with 2.75 x 15 mm  Sapphire at 18 atmospheric pressure giving 2.83 mm vessel.  Stenosis reduced from 100% to 0% with improvement in TIMI 0 to TIMI-3 flow. 150 mL contrast utilized.  Recommendation: Patient be discharged home in the morning, will need aggressive risk factor modification, he has statin intolerance,  but is willing to try low-dose statin along with ezetimibe for now.  We will reevaluate this.  The LAD in the proximal to mid segment has diffuse tandem 50% stenosis took more 60% stenosis, RFR was negative for significant ischemia, needs aggressive risk factor modification and medical therapy.   Labs:   Lab Results  Component Value Date   WBC 8.2 01/01/2021   HGB 14.4 01/01/2021   HCT 42.8 01/01/2021   MCV 90.7 01/01/2021   PLT 247 01/01/2021    Recent Labs  Lab 01/01/21 0109  NA 138  K 3.3*  CL 102  CO2 28  BUN 20  CREATININE 0.95  CALCIUM 9.2  GLUCOSE 104*    Lipid Panel  No results found for: CHOL, TRIG, HDL, CHOLHDL, VLDL, LDLCALC  BNP (last 3 results) No results for input(s): BNP in the last 8760 hours.  HEMOGLOBIN A1C No results found for: HGBA1C, MPG  Cardiac Panel (last 3 results) No results for input(s): CKTOTAL, CKMB, TROPONINI, RELINDX in the last 8760 hours.  No results found for: CKTOTAL, CKMB, CKMBINDEX, TROPONINI   TSH No results for input(s): TSH in the last 8760 hours.  Radiology: CARDIAC CATHETERIZATION  Result Date: 12/31/2020 Left Heart Catheterization and PCI CTO RCA, RFR to Mid LAD 12/31/20: LV: Low LVEDP at 6 mmHg.  There was no pressure gradient across the aortic valve. LM: It is mildly diseased, mild calcification evident, mid segment has a 20% stenosis. LAD: Diffusely diseased, proximal to mid segment has mild to moderate amount of calcification.  There are multiple tandem 30 to 60% stenosis.  D1 and D2 are small to moderate-sized. LAD gives contralateral collaterals to the RCA. RFR to proximal and mid LAD = 0.84, at most equivocal. CX: Very minimal disease.  Large OM1. RCA: CTO proximal segment.  Mild to moderate amount of calcification evident in the RCA in the midsegment. Intervention data: Successful CTO PCI with implantation of 2.5 x 28 mm Synergy DES which was postdilated with 2.75 x 15 mm [place] Sapphire at 18 atmospheric pressure giving  2.83 mm vessel.  Stenosis reduced from 100% to 0% with improvement in TIMI 0 to TIMI-3 flow. 150 mL contrast utilized. Recommendation: Patient be discharged home in the morning, will need aggressive risk factor modification, he has statin intolerance, but is willing to try low-dose statin along with ezetimibe for now.  We will reevaluate this.  The LAD in the proximal to mid segment has diffuse tandem 50% stenosis took more 60% stenosis, RFR was negative for significant ischemia, needs aggressive risk factor modification and medical therapy.      FOLLOW UP PLANS AND APPOINTMENTS Discharge Instructions    Amb Referral to Cardiac Rehabilitation   Complete by: As directed    Diagnosis: Coronary Stents   After initial evaluation and assessments completed: Virtual Based Care may be provided alone or in conjunction with Phase 2 Cardiac Rehab based on patient barriers.: Yes     Allergies as of 01/01/2021      Reactions   Lisinopril Hives   Statins Other (See Comments)   Weakness, joint aches      Medication List    TAKE these medications   acetaminophen 650 MG CR tablet Commonly known as: TYLENOL Take 650-1,300 mg by mouth every 8 (eight) hours as needed for pain.   aspirin EC 81 MG tablet Take 81 mg by mouth at bedtime. Swallow whole.   cetirizine 10 MG tablet Commonly known as: ZYRTEC Take 10 mg by mouth daily as needed for allergies.   clopidogrel 75 MG tablet Commonly known as: PLAVIX Take 1 tablet (75 mg total) by mouth daily with breakfast.   CoQ10 200 MG Caps Take 200 mg by mouth every evening.   diclofenac Sodium 1 % Gel Commonly known as: VOLTAREN Apply 1 application topically 4 (four) times daily as needed (pain).   ezetimibe 10 MG tablet Commonly known as: ZETIA Take 1 tablet (10 mg total) by mouth daily.   Flaxseed Oil 1200 MG Caps Take 1,200 mg by mouth in the morning and at bedtime. W/Omega 3 540mg    hydrochlorothiazide 25 MG tablet Commonly known as:  HYDRODIURIL Take 25 mg by mouth in the morning.   ketotifen 0.025 % ophthalmic solution Commonly known as: ZADITOR Place 1-2 drops into both eyes at bedtime as needed (allergies.).   losartan 25 MG tablet Commonly known as: COZAAR Take 50 mg by mouth at bedtime.   MAGNESIUM CITRATE PO Take 250 mg by mouth every evening.   metoprolol succinate 25 MG 24 hr tablet Commonly known as: Toprol XL Take 1 tablet (25 mg total) by mouth daily. What changed: when to take this   MOVE FREE JOINT HEALTH ADVANCE PO Take 1 tablet by mouth in the morning.   multivitamin with minerals Tabs tablet Take 1 tablet by  mouth in the morning.   nitroGLYCERIN 0.4 MG SL tablet Commonly known as: Nitrostat Place 1 tablet (0.4 mg total) under the tongue every 5 (five) minutes as needed for chest pain. If you require more than two tablets five minutes apart go to the nearest ER via EMS.   omalizumab 150 MG injection Commonly known as: XOLAIR Inject into the skin every 28 (twenty-eight) days.   SIMILASAN ALLERGY EYE RELIEF OP Place 1-2 drops into both eyes 3 (three) times daily as needed (dry/allergy eyes).   simvastatin 20 MG tablet Commonly known as: ZOCOR Take 1 tablet (20 mg total) by mouth daily at 6 PM.   Vitamin D 50 MCG (2000 UT) Caps Take by mouth.   Vitamin D3 50 MCG (2000 UT) Tabs Take 2,000 Units by mouth in the morning.       Follow-up Information    Follow up On 01/16/2021.   Why: 01/16/2021 2:00 PM        Flynt Breeze, DO .   Specialties: Cardiology, Radiology, Vascular Surgery Contact information: [address] [place] [id number] [phone]              Time spent: 35 minutes  Delilah ShanSunit Ashlynne Shetterly, DO, [facility]ACC  Pager: [phone] Office: [phone]

## 2021-01-02 ENCOUNTER — Other Ambulatory Visit (HOSPITAL_COMMUNITY): Payer: Self-pay

## 2021-01-02 ENCOUNTER — Telehealth (HOSPITAL_COMMUNITY): Payer: Self-pay

## 2021-01-02 NOTE — Telephone Encounter (Signed)
Pharmacy Transitions of Care Follow-up Telephone Call  Date of discharge: 01/01/21 Discharge Diagnosis: Angina  How have you been since you were released from the hospital? Good  Medication changes made at discharge:  - START: Plavix, Zocor  - STOPPED: none  - CHANGED: none    Medication Accessibility:  Home Pharmacy: [facility]   Was the patient provided with refills on discharged medications? 2 refills on Plavix, none on Zocor d/t statin sensitivity  Have all prescriptions been transferred from [facility] to home pharmacy? Yes  Is the patient able to afford medications? Yes . Notable copays: 13.15 on Plavix    Medication Review: CLOPIDOGREL (PLAVIX) Clopidogrel 75 mg once daily.  -  Advised patient that aspirin will be continued indefinitely.  - Reviewed potential DDIs with patient  - Advised patient of medications to avoid (NSAIDs, ASA)  - Emphasized importance of monitoring for signs and symptoms of bleeding (abnormal bruising, prolonged bleeding, nose bleeds, bleeding from gums, discolored urine, black tarry stools)  - Advised patient to alert all providers of anticoagulation therapy prior to starting a new medication or having a procedure    Follow-up Appointments:  PCP Hospital f/u appt confirmed? Yes Scheduled to see Dr. Odis Hollingshead on 01/15/21   If their condition worsens, is the pt aware to call PCP or go to the Emergency Dept.? Yes  Final Patient Assessment: Pt seems comfortable during the interview, pt was educated on all medications. Questioned other DDI to be aware of, but all were covered in conversation.   Cory Hampton [employer]

## 2021-01-02 NOTE — Telephone Encounter (Signed)
Pt insurance is active and benefits verified through [place] $30, DED $500/$500 met, out of pocket $3,000/$1,663.31 met, co-insurance 0%. no pre-authorization required. Passport, 01/02/2021_0 :17pm, REF# [id number]  Will contact patient to see if she is interested in the Cardiac Rehab Program. If interested, patient will need to complete follow up appt. Once completed, patient will be contacted for scheduling upon review by the RN Navigator.

## 2021-01-14 ENCOUNTER — Other Ambulatory Visit: Payer: Self-pay | Admitting: Cardiology

## 2021-01-14 DIAGNOSIS — E7800 Pure hypercholesterolemia, unspecified: Secondary | ICD-10-CM

## 2021-01-14 NOTE — Progress Notes (Signed)
Date:  01/15/2021   ID:  Cory Hampton, DOB Dec 01, 1955, MRN [id number]  PCP:  Jonathon Jordan, MD  Cardiologist:  Rex Kras, DO, [facility] (established care 12/23/2020) Former Cardiology Providers: Dr. Einar Gip   Date: 01/15/21 Last Office Visit: 12/23/2020   Chief Complaint  Patient presents with  . Follow-up    Status post left heart catheterization    HPI  Cory Hampton is a 65 y.o. male who presents to the office with a chief complaint of " status post motor catheterization." Patient's past medical history and cardiovascular risk factors include: Established coronary disease status post intervention, hypertension, hyperlipidemia, ED, restless leg syndrome.   He is referred to the office at the request of Dr. Linna Darner for evaluation of chest pain.  During initial consultation patient symptoms left chest pain were suggestive of anginal discomfort that continued despite undergoing an ischemic evaluation which was noted to be low risk.  Shared decision was to proceed with an invasive angiography given his risk factors.  Patient underwent left heart catheterization and was noted to have CTO of the RCA and disease involving the LAD distribution.  Patient underwent angioplasty and successful stenting to the RCA with TIMI-3 flow and no residual stenosis was 0%.  He was kept overnight for observation and later discharged home on Jan 01, 2021.  Patient follows up for 2-week visit.  Patient denies any chest pain or shortness of breath at rest or with effort related activities.  He is exercising on a regular basis walking approximately 3.5-4 mph with his wife.  He does not experience any effort related symptoms.  He is currently on simvastatin 20 mg p.o. nightly and Zetia.  He has been intolerant to other statin therapy in the past and his most recent LDL was 142 mg/dL.  Patient plans to do cardiac rehab but there is currently a wait time.  FUNCTIONAL STATUS: Walk 4 miles a day or 10,000  steps a day.    ALLERGIES: Allergies  Allergen Reactions  . Lisinopril Hives  . Statins Other (See Comments)    Weakness, joint aches    MEDICATION LIST PRIOR TO VISIT: Current Meds  Medication Sig  . acetaminophen (TYLENOL) 650 MG CR tablet Take 650-1,300 mg by mouth every 8 (eight) hours as needed for pain.  Marland Kitchen aspirin EC 81 MG tablet Take 81 mg by mouth at bedtime. Swallow whole.  . cetirizine (ZYRTEC) 10 MG tablet Take 10 mg by mouth daily as needed for allergies.  . Cholecalciferol (VITAMIN D) 2000 units CAPS Take by mouth.  . clopidogrel (PLAVIX) 75 MG tablet Take 1 tablet (75 mg total) by mouth daily with breakfast.  . Coenzyme Q10 (COQ10) 200 MG CAPS Take 200 mg by mouth every evening.  . diclofenac Sodium (VOLTAREN) 1 % GEL Apply 1 application topically 4 (four) times daily as needed (pain).  . Evolocumab (REPATHA SURECLICK) 073 MG/ML SOAJ Inject 140 mg into the skin every 14 (fourteen) days.  Marland Kitchen ezetimibe (ZETIA) 10 MG tablet TAKE 1 TABLET BY MOUTH EVERY DAY  . Flaxseed, Linseed, (FLAXSEED OIL) 1200 MG CAPS Take 1,200 mg by mouth in the morning and at bedtime. W/Omega 3 534m  . Glucos-Chond-Hyal Ac-Ca Fructo (MOVE FREE JOINT HEALTH ADVANCE PO) Take 1 tablet by mouth in the morning.  . Homeopathic Products ([facility]) Place 1-2 drops into both eyes 3 (three) times daily as needed (dry/allergy eyes).  . hydrochlorothiazide (HYDRODIURIL) 25 MG tablet Take 25 mg by mouth  in the morning.  Marland Kitchen ketotifen (ZADITOR) 0.025 % ophthalmic solution Place 1-2 drops into both eyes at bedtime as needed (allergies.).  Marland Kitchen losartan (COZAAR) 25 MG tablet Take 50 mg by mouth at bedtime.  Marland Kitchen MAGNESIUM CITRATE PO Take 250 mg by mouth every evening.  . metoprolol succinate (TOPROL XL) 25 MG 24 hr tablet Take 1 tablet (25 mg total) by mouth daily. (Patient taking differently: Take 25 mg by mouth in the morning.)  . Multiple Vitamin (MULTIVITAMIN WITH MINERALS) TABS tablet Take 1 tablet  by mouth in the morning.  . nitroGLYCERIN (NITROSTAT) 0.4 MG SL tablet Place 1 tablet (0.4 mg total) under the tongue every 5 (five) minutes as needed for chest pain. If you require more than two tablets five minutes apart go to the nearest ER via EMS.  Marland Kitchen omalizumab (XOLAIR) 150 MG injection Inject into the skin every 28 (twenty-eight) days.  . simvastatin (ZOCOR) 20 MG tablet Take 1 tablet (20 mg total) by mouth daily at 6 PM.     PAST MEDICAL HISTORY: Past Medical History:  Diagnosis Date  . Allergy   . Erectile dysfunction   . Hyperlipidemia   . Hypertension   . Restless leg syndrome     PAST SURGICAL HISTORY: Past Surgical History:  Procedure Laterality Date  . COLONOSCOPY    . CORONARY CTO INTERVENTION N/A 12/31/2020   Procedure: CORONARY CTO INTERVENTION;  Surgeon: Adrian Prows, MD;  Location: [place] [facility];  Service: Cardiovascular;  Laterality: N/A;  . CORONARY STENT INTERVENTION N/A 12/31/2020   Procedure: CORONARY STENT INTERVENTION;  Surgeon: Adrian Prows, MD;  Location: [place] [facility];  Service: Cardiovascular;  Laterality: N/A;  . FOREIGN BODY REMOVAL ABDOMINAL     age 53  . INTRAVASCULAR PRESSURE WIRE/FFR STUDY N/A 12/31/2020   Procedure: INTRAVASCULAR PRESSURE WIRE/FFR STUDY;  Surgeon: Adrian Prows, MD;  Location: [place] [facility];  Service: Cardiovascular;  Laterality: N/A;  . LEFT HEART CATH AND CORONARY ANGIOGRAPHY N/A 12/31/2020   Procedure: LEFT HEART CATH AND CORONARY ANGIOGRAPHY;  Surgeon: Adrian Prows, MD;  Location: [place] [facility];  Service: Cardiovascular;  Laterality: N/A;  . WISDOM TOOTH EXTRACTION      FAMILY HISTORY: The patient family history includes Dementia in his father; Hypertension in his sister; Stroke in his father and mother.  SOCIAL HISTORY:  The patient  reports that he has never smoked. He has never used smokeless tobacco. He reports current alcohol use of about 4.0 standard drinks of alcohol per week. He reports that he does not use  drugs.  REVIEW OF SYSTEMS: Review of Systems  Constitutional: Negative for chills and fever.  HENT: Negative for hoarse voice and nosebleeds.   Eyes: Negative for discharge, double vision and pain.  Cardiovascular: Negative for chest pain, claudication, dyspnea on exertion, leg swelling, near-syncope, orthopnea, palpitations, paroxysmal nocturnal dyspnea and syncope.  Respiratory: Negative for hemoptysis and shortness of breath.   Musculoskeletal: Negative for muscle cramps and myalgias.  Gastrointestinal: Negative for abdominal pain, constipation, diarrhea, hematemesis, hematochezia, melena, nausea and vomiting.  Neurological: Negative for dizziness and light-headedness.    PHYSICAL EXAM: Vitals with BMI 01/15/2021 01/01/2021 01/01/2021  Height _0  - -  Weight 170 lbs 6 oz - -  BMI 52.08 - -  Systolic 022 - 336  Diastolic 75 - 86  Pulse 71 96 -    CONSTITUTIONAL: Well-developed and well-nourished. No acute distress.  SKIN: Skin is warm and dry. No rash noted. No cyanosis. No  pallor. No jaundice HEAD: Normocephalic and atraumatic.  EYES: No scleral icterus MOUTH/THROAT: Moist oral membranes.  NECK: No JVD present. No thyromegaly noted. No carotid bruits  LYMPHATIC: No visible cervical adenopathy.  CHEST Normal respiratory effort. No intercostal retractions  LUNGS: Clear to auscultation bilaterally.  No stridor. No wheezes. No rales.  CARDIOVASCULAR: Regular rate and rhythm, positive S1-S2, no murmurs rubs or gallops appreciated. ABDOMINAL: No apparent ascites.  EXTREMITIES: No peripheral edema  HEMATOLOGIC: No significant bruising NEUROLOGIC: Oriented to person, place, and time. Nonfocal. Normal muscle tone.  PSYCHIATRIC: Normal mood and affect. Normal behavior. Cooperative  CARDIAC DATABASE: EKG: 12/23/2020: Normal sinus rhythm, 85 bpm, normal axis, left atrial enlargement, without underlying ischemia or injury pattern.  01/15/2021: Normal sinus rhythm, 68 bpm, normal axis,  without underlying ischemia or injury pattern.  Echocardiogram: 11/06/2020: LVEF 55 to 60%, no regional wall motion abnormalities, normal diastolic function, trivial MR.  Stress Testing: 11/12/2020 MPI:  Nuclear stress EF: 63%.  The left ventricular ejection fraction is normal (55-65%).  There was no ST segment deviation noted during stress.  No T wave inversion was noted during stress.  Defect 1: There is a small defect of moderate severity present in the apex location.  Findings consistent with ischemia. However, there is normal wall motion. Cannot rule out apical thinning.  This is a low risk study  Heart Catheterization: 12/31/2020: LV: Low LVEDP at 6 mmHg.  There was no pressure gradient across the aortic valve. LM: It is mildly diseased, mild calcification evident, mid segment has a 20% stenosis. LAD: Diffusely diseased, proximal to mid segment has mild to moderate amount of calcification.  There are multiple tandem 30 to 60% stenosis.  D1 and D2 are small to moderate-sized. LAD gives contralateral collaterals to the RCA. RFR to proximal and mid LAD = 0.84, at most equivocal. CX: Very minimal disease.  Large OM1. RCA: CTO proximal segment.  Mild to moderate amount of calcification evident in the RCA in the midsegment.  Intervention data: Successful CTO PCI with implantation of 2.5 x 28 mm Synergy DES which was postdilated with 2.75 x 15 mm Scraper Sapphire at 18 atmospheric pressure giving 2.83 mm vessel.  Stenosis reduced from 100% to 0% with improvement in TIMI 0 to TIMI-3 flow. 150 mL contrast utilized.  Recommendation: Patient be discharged home in the morning, will need aggressive risk factor modification, he has statin intolerance, but is willing to try low-dose statin along with ezetimibe for now.  We will reevaluate this.  The LAD in the proximal to mid segment has diffuse tandem 50% stenosis took more 60% stenosis, RFR was negative for significant ischemia, needs  aggressive risk factor modification and medical therapy.   LABORATORY DATA: CBC Latest Ref Rng & Units 01/01/2021  WBC 4.0 - 10.5 K/uL 8.2  Hemoglobin 13.0 - 17.0 g/dL 14.4  Hematocrit 39.0 - 52.0 % 42.8  Platelets 150 - 400 K/uL 247    CMP Latest Ref Rng & Units 01/01/2021  Glucose 70 - 99 mg/dL 104(H)  BUN 8 - 23 mg/dL 20  Creatinine 0.61 - 1.24 mg/dL 0.95  Sodium 135 - 145 mmol/L 138  Potassium 3.5 - 5.1 mmol/L 3.3(L)  Chloride 98 - 111 mmol/L 102  CO2 22 - 32 mmol/L 28  Calcium 8.9 - 10.3 mg/dL 9.2    Lipid Panel  No results found for: CHOL, TRIG, HDL, CHOLHDL, VLDL, LDLCALC, LDLDIRECT, LABVLDL  No components found for: NTPROBNP No results for input(s): PROBNP in the last 8760  hours. No results for input(s): TSH in the last 8760 hours.  BMP Recent Labs    01/01/21 0109  NA 138  K 3.3*  CL 102  CO2 28  GLUCOSE 104*  BUN 20  CREATININE 0.95  CALCIUM 9.2  GFRNONAA >60    HEMOGLOBIN A1C No results found for: HGBA1C, MPG   External Labs: Collected: 12/26/2020 Lipid profile: Total cholesterol 204, triglycerides 114, HDL 41,  LDL 142.   IMPRESSION:    ICD-10-CM   1. Atherosclerosis of native coronary artery of native heart without angina pectoris  I25.10 EKG 12-Lead    Evolocumab (REPATHA SURECLICK) 502 MG/ML SOAJ    Lipid Panel With LDL/HDL Ratio    LDL cholesterol, direct    CMP14+EGFR  2. History of coronary angioplasty with insertion of stent  Z95.5 Evolocumab (REPATHA SURECLICK) 774 MG/ML SOAJ  3. Hypertension, essential  I10   4. Pure hypercholesterolemia  E78.00 Evolocumab (REPATHA SURECLICK) 128 MG/ML SOAJ    Lipid Panel With LDL/HDL Ratio    LDL cholesterol, direct    CMP14+EGFR  5. Erectile dysfunction, unspecified erectile dysfunction type  N52.9      RECOMMENDATIONS: Cory Hampton is a 65 y.o. male whose past medical history and cardiac risk factors include: Established coronary disease status post intervention, hypertension,  hyperlipidemia, ED, restless leg syndrome.   Atherosclerosis of the native coronary artery with prior intervention and without angina: EKG shows normal sinus rhythm without underlying ischemia or injury pattern. Patient is currently asymptomatic and has not required use of sublingual nitroglycerin tablet. Cardiac rehab pending. Medications reconciled. Arteriotomy site is healed well without any hematoma or bruit. Given his most recent LDL levels and being intolerant to statin therapy in the past and new diagnosis of CAD with intervention that shared decision was to start [place]. Recommend rechecking labs 6 weeks after starting Repatha. Continue dual antiplatelet therapy.  Patient is encouraged to call the office if he needs to interrupt dual antiplatelet therapy over the next 6 months.  Pure hypercholesterolemia: Outside labs independently reviewed. Patient is LDL levels 142 mg/dL prior to starting statin therapy. Tolerating simvastatin 20 mg p.o. nightly. Has been intolerant to statin therapy in the past. Continue Zetia for now. We will start [place]. He is encouraged to come into the office for the first injection for patient education purposes.  This will be a nurse visit.  Benign essential hypertension: Office blood pressures at goal. Continue antihypertensive medications. Low-salt diet recommended. Recommend a goal systolic blood pressures between 120-130 mmHg Currently managed by primary care provider.  Erectile dysfunction: Currently on Viagra on as needed basis. Recommend holding its use for a total of at least 4 weeks since his recent coronary interventions.  Ideally would recommend completing cardiac rehab prior to restarting ED medications.  FINAL MEDICATION LIST END OF ENCOUNTER: Meds ordered this encounter  Medications  . Evolocumab (REPATHA SURECLICK) 786 MG/ML SOAJ    Sig: Inject 140 mg into the skin every 14 (fourteen) days.    Dispense:  4 mL    Refill:  0     Medications Discontinued During This Encounter  Medication Reason  . Cholecalciferol (VITAMIN D3) 50 MCG (2000 UT) TABS Error     Current Outpatient Medications:  .  acetaminophen (TYLENOL) 650 MG CR tablet, Take 650-1,300 mg by mouth every 8 (eight) hours as needed for pain., Disp: , Rfl:  .  aspirin EC 81 MG tablet, Take 81 mg by mouth at bedtime. Swallow whole., Disp: , Rfl:  .  cetirizine (ZYRTEC) 10 MG tablet, Take 10 mg by mouth daily as needed for allergies., Disp: , Rfl:  .  Cholecalciferol (VITAMIN D) 2000 units CAPS, Take by mouth., Disp: , Rfl:  .  clopidogrel (PLAVIX) 75 MG tablet, Take 1 tablet (75 mg total) by mouth daily with breakfast., Disp: 30 tablet, Rfl: 2 .  Coenzyme Q10 (COQ10) 200 MG CAPS, Take 200 mg by mouth every evening., Disp: , Rfl:  .  diclofenac Sodium (VOLTAREN) 1 % GEL, Apply 1 application topically 4 (four) times daily as needed (pain)., Disp: , Rfl:  .  Evolocumab (REPATHA SURECLICK) 867 MG/ML SOAJ, Inject 140 mg into the skin every 14 (fourteen) days., Disp: 4 mL, Rfl: 0 .  ezetimibe (ZETIA) 10 MG tablet, TAKE 1 TABLET BY MOUTH EVERY DAY, Disp: 30 tablet, Rfl: 0 .  Flaxseed, Linseed, (FLAXSEED OIL) 1200 MG CAPS, Take 1,200 mg by mouth in the morning and at bedtime. W/Omega 3 583m, Disp: , Rfl:  .  Glucos-Chond-Hyal Ac-Ca Fructo (MOVE FREE JOINT HEALTH ADVANCE PO), Take 1 tablet by mouth in the morning., Disp: , Rfl:  .  Homeopathic Products ([facility]), Place 1-2 drops into both eyes 3 (three) times daily as needed (dry/allergy eyes)., Disp: , Rfl:  .  hydrochlorothiazide (HYDRODIURIL) 25 MG tablet, Take 25 mg by mouth in the morning., Disp: , Rfl:  .  ketotifen (ZADITOR) 0.025 % ophthalmic solution, Place 1-2 drops into both eyes at bedtime as needed (allergies.)., Disp: , Rfl:  .  losartan (COZAAR) 25 MG tablet, Take 50 mg by mouth at bedtime., Disp: , Rfl:  .  MAGNESIUM CITRATE PO, Take 250 mg by mouth every evening., Disp: , Rfl:   .  metoprolol succinate (TOPROL XL) 25 MG 24 hr tablet, Take 1 tablet (25 mg total) by mouth daily. (Patient taking differently: Take 25 mg by mouth in the morning.), Disp: 90 tablet, Rfl: 0 .  Multiple Vitamin (MULTIVITAMIN WITH MINERALS) TABS tablet, Take 1 tablet by mouth in the morning., Disp: , Rfl:  .  nitroGLYCERIN (NITROSTAT) 0.4 MG SL tablet, Place 1 tablet (0.4 mg total) under the tongue every 5 (five) minutes as needed for chest pain. If you require more than two tablets five minutes apart go to the nearest ER via EMS., Disp: 30 tablet, Rfl: 0 .  omalizumab (XOLAIR) 150 MG injection, Inject into the skin every 28 (twenty-eight) days., Disp: , Rfl:  .  simvastatin (ZOCOR) 20 MG tablet, Take 1 tablet (20 mg total) by mouth daily at 6 PM., Disp: 90 tablet, Rfl: 0  Orders Placed This Encounter  Procedures  . Lipid Panel With LDL/HDL Ratio  . LDL cholesterol, direct  . CMP14+EGFR  . EKG 12-Lead    There are no Patient Instructions on file for this visit.   --Continue cardiac medications as reconciled in final medication list. --Return in about 3 months (around 04/17/2021) for Follow up, CAD, Lipid. Or sooner if needed. --Continue follow-up with your primary care physician regarding the management of your other chronic comorbid conditions.  Patient's questions and concerns were addressed to his satisfaction. He voices understanding of the instructions provided during this encounter.   This note was created using a voice recognition software as a result there may be grammatical errors inadvertently enclosed that do not reflect the nature of this encounter. Every attempt is made to correct such errors.  SRex Kras DNevada [facility] Pager: [phone]: [phone]

## 2021-01-15 ENCOUNTER — Encounter: Payer: Self-pay | Admitting: Cardiology

## 2021-01-15 ENCOUNTER — Other Ambulatory Visit: Payer: Self-pay

## 2021-01-15 ENCOUNTER — Ambulatory Visit: Payer: 59 | Admitting: Cardiology

## 2021-01-15 ENCOUNTER — Other Ambulatory Visit: Payer: Self-pay | Admitting: Cardiology

## 2021-01-15 VITALS — BP 125/75 | HR 71 | Temp 97.6°F | Resp 16 | Ht 69.0 in | Wt 170.4 lb

## 2021-01-15 DIAGNOSIS — I209 Angina pectoris, unspecified: Secondary | ICD-10-CM

## 2021-01-15 DIAGNOSIS — I2510 Atherosclerotic heart disease of native coronary artery without angina pectoris: Secondary | ICD-10-CM

## 2021-01-15 DIAGNOSIS — Z955 Presence of coronary angioplasty implant and graft: Secondary | ICD-10-CM

## 2021-01-15 DIAGNOSIS — N529 Male erectile dysfunction, unspecified: Secondary | ICD-10-CM

## 2021-01-15 DIAGNOSIS — E7800 Pure hypercholesterolemia, unspecified: Secondary | ICD-10-CM

## 2021-01-15 DIAGNOSIS — I10 Essential (primary) hypertension: Secondary | ICD-10-CM

## 2021-01-15 MED ORDER — REPATHA SURECLICK 140 MG/ML ~~LOC~~ SOAJ: 140.0000 mg | SUBCUTANEOUS | 0 refills | Status: DC

## 2021-01-16 ENCOUNTER — Ambulatory Visit: Payer: 59 | Admitting: Cardiology

## 2021-01-21 ENCOUNTER — Other Ambulatory Visit: Payer: Self-pay

## 2021-01-21 DIAGNOSIS — I2510 Atherosclerotic heart disease of native coronary artery without angina pectoris: Secondary | ICD-10-CM

## 2021-01-21 DIAGNOSIS — E7800 Pure hypercholesterolemia, unspecified: Secondary | ICD-10-CM

## 2021-01-24 ENCOUNTER — Other Ambulatory Visit: Payer: Self-pay | Admitting: Cardiology

## 2021-01-24 ENCOUNTER — Other Ambulatory Visit: Payer: Self-pay

## 2021-01-24 MED ORDER — REPATHA 140 MG/ML ~~LOC~~ SOSY: 140.0000 mg | PREFILLED_SYRINGE | SUBCUTANEOUS | 3 refills | Status: DC

## 2021-01-24 NOTE — Telephone Encounter (Signed)
Please call [facility] to see what the hold-up is with regards to his Repatha prescription.  I would still check a hemoglobin A1c.  Either you can get it done at  lab work or I can place an order.

## 2021-01-24 NOTE — Telephone Encounter (Signed)
From patient.

## 2021-01-27 ENCOUNTER — Other Ambulatory Visit: Payer: Self-pay

## 2021-01-27 DIAGNOSIS — I2510 Atherosclerotic heart disease of native coronary artery without angina pectoris: Secondary | ICD-10-CM

## 2021-01-27 DIAGNOSIS — E7800 Pure hypercholesterolemia, unspecified: Secondary | ICD-10-CM

## 2021-01-27 DIAGNOSIS — Z955 Presence of coronary angioplasty implant and graft: Secondary | ICD-10-CM

## 2021-01-27 MED ORDER — PRALUENT 75 MG/ML ~~LOC~~ SOAJ: 140.0000 mL | SUBCUTANEOUS | 3 refills | Status: DC

## 2021-02-03 ENCOUNTER — Other Ambulatory Visit: Payer: Self-pay

## 2021-02-03 DIAGNOSIS — I2510 Atherosclerotic heart disease of native coronary artery without angina pectoris: Secondary | ICD-10-CM

## 2021-02-03 DIAGNOSIS — E7800 Pure hypercholesterolemia, unspecified: Secondary | ICD-10-CM

## 2021-02-03 DIAGNOSIS — Z955 Presence of coronary angioplasty implant and graft: Secondary | ICD-10-CM

## 2021-02-03 MED ORDER — PRALUENT 75 MG/ML ~~LOC~~ SOAJ
1.0000 mL | SUBCUTANEOUS | 6 refills | Status: DC
Start: 2021-02-03 — End: 2021-07-04

## 2021-02-14 ENCOUNTER — Telehealth (HOSPITAL_COMMUNITY): Payer: Self-pay

## 2021-02-14 NOTE — Telephone Encounter (Signed)
Called patient to see if he was interested in participating in the Cardiac Rehab Program. Patient stated yes. Patient will come in for orientation on 03/18/2021@1 :15pm and will attend the 10:45am exercise class.   Pensions consultant.

## 2021-02-15 ENCOUNTER — Other Ambulatory Visit: Payer: Self-pay | Admitting: Cardiology

## 2021-02-15 DIAGNOSIS — E7800 Pure hypercholesterolemia, unspecified: Secondary | ICD-10-CM

## 2021-03-02 ENCOUNTER — Other Ambulatory Visit: Payer: Self-pay | Admitting: Cardiology

## 2021-03-02 DIAGNOSIS — E7800 Pure hypercholesterolemia, unspecified: Secondary | ICD-10-CM

## 2021-03-17 ENCOUNTER — Other Ambulatory Visit: Payer: Self-pay | Admitting: Cardiology

## 2021-03-17 DIAGNOSIS — I209 Angina pectoris, unspecified: Secondary | ICD-10-CM

## 2021-03-18 ENCOUNTER — Encounter (HOSPITAL_COMMUNITY): Payer: Self-pay

## 2021-03-18 ENCOUNTER — Encounter (HOSPITAL_COMMUNITY)
Admission: RE | Admit: 2021-03-18 | Discharge: 2021-03-18 | Disposition: A | Discharge status: Home / Self Care | Payer: 59 | Source: Ambulatory Visit | Attending: Cardiology | Admitting: Cardiology

## 2021-03-18 ENCOUNTER — Other Ambulatory Visit: Payer: Self-pay

## 2021-03-18 VITALS — BP 110/70 | HR 76 | Ht 69.25 in | Wt 169.3 lb

## 2021-03-18 DIAGNOSIS — Z955 Presence of coronary angioplasty implant and graft: Secondary | ICD-10-CM | POA: Insufficient documentation

## 2021-03-18 HISTORY — DX: Atherosclerotic heart disease of native coronary artery without angina pectoris: I25.10

## 2021-03-18 NOTE — Progress Notes (Signed)
Cardiac Rehab Medication Review by a Nurse  Does the patient  feel that his/her medications are working for him/her?  yes  Has the patient been experiencing any side effects to the medications prescribed?  no  Does the patient measure his/her own blood pressure or blood glucose at home?  yes   Does the patient have any problems obtaining medications due to transportation or finances?   no  Understanding of regimen: excellent Understanding of indications: excellent Potential of compliance: excellent    Nurse comments: Maddox is taking his medications as prescribed and has a good understanding of what his medications are for. Nysir monitors his blood pressures at home with a BP monitor.    Arta Bruce Camilah Spillman RN 03/18/2021 1:41 PM

## 2021-03-18 NOTE — Progress Notes (Signed)
Cardiac Individual Treatment Plan  Patient Details  Name: Cory Hampton MRN: [id number] Date of Birth: May 30, 1956 Referring Provider:   Flowsheet Row CARDIAC REHAB PHASE II [facility]  Referring Provider Dr Tessa LernerSunit Tolia, DO       Initial Encounter Date:  Flowsheet Row CARDIAC REHAB PHASE II [facility]  Date 03/18/21       Visit Diagnosis: 12/31/20 CTO RCA s/p PCI DES  Patient's Home Medications on Admission:  Current Outpatient Medications:    acetaminophen (TYLENOL) 650 MG CR tablet, Take 650-1,300 mg by mouth every 8 (eight) hours as needed for pain., Disp: , Rfl:    Alirocumab (PRALUENT) 75 MG/ML SOAJ, Inject 1 mL as directed every 14 (fourteen) days., Disp: 1 mL, Rfl: 6   aspirin EC 81 MG tablet, Take 81 mg by mouth at bedtime. Swallow whole., Disp: , Rfl:    cetirizine (ZYRTEC) 10 MG tablet, Take 10 mg by mouth daily as needed for allergies., Disp: , Rfl:    Cholecalciferol (VITAMIN D) 2000 units CAPS, Take 2,000 Units by mouth daily., Disp: , Rfl:    clopidogrel (PLAVIX) 75 MG tablet, Take 1 tablet (75 mg total) by mouth daily with breakfast., Disp: 30 tablet, Rfl: 2   Coenzyme Q10 (COQ10) 200 MG CAPS, Take 200 mg by mouth every evening., Disp: , Rfl:    diclofenac Sodium (VOLTAREN) 1 % GEL, Apply 1 application topically 4 (four) times daily as needed (pain)., Disp: , Rfl:    Flaxseed, Linseed, (FLAXSEED OIL) 1200 MG CAPS, Take 1,200 mg by mouth in the morning and at bedtime. W/Omega 3 540mg , Disp: , Rfl:    Glucos-Chond-Hyal Ac-Ca Fructo (MOVE FREE JOINT HEALTH ADVANCE PO), Take 1 tablet by mouth daily., Disp: , Rfl:    Homeopathic Products [facility](SIMILASAN ALLERGY EYE RELIEF OP), Place 1-2 drops into both eyes 3 (three) times daily as needed (dry/allergy eyes)., Disp: , Rfl:    hydrochlorothiazide (HYDRODIURIL) 25 MG tablet, Take 25 mg by mouth in the  morning., Disp: , Rfl:    ketotifen (ZADITOR) 0.025 % ophthalmic solution, Place 1-2 drops into both eyes at bedtime as needed (allergies.)., Disp: , Rfl:    losartan (COZAAR) 25 MG tablet, Take 50 mg by mouth at bedtime., Disp: , Rfl:    MAGNESIUM CITRATE PO, Take 250 mg by mouth every evening., Disp: , Rfl:    metoprolol succinate (TOPROL-XL) 25 MG 24 hr tablet, TAKE 1 TABLET (25 MG TOTAL) BY MOUTH DAILY., Disp: 90 tablet, Rfl: 0   Multiple Vitamin (MULTIVITAMIN WITH MINERALS) TABS tablet, Take 1 tablet by mouth daily., Disp: , Rfl:    nitroGLYCERIN (NITROSTAT) 0.4 MG SL tablet, Place 1 tablet (0.4 mg total) under the tongue every 5 (five) minutes as needed for chest pain. If you require more than two tablets five minutes apart go to the nearest ER via EMS., Disp: 30 tablet, Rfl: 0   omalizumab (XOLAIR) 150 MG injection, Inject into the skin every 28 (twenty-eight) days., Disp: , Rfl:    simvastatin (ZOCOR) 20 MG tablet, Take 1 tablet (20 mg total) by mouth daily at 6 PM., Disp: 90 tablet, Rfl: 0   Evolocumab (REPATHA) 140 MG/ML SOSY, Inject 140 mg into the skin every 14 (fourteen) days. (Patient not taking: No sig reported), Disp: 2 mL, Rfl: 3   ezetimibe (ZETIA) 10 MG tablet, TAKE 1 TABLET BY MOUTH EVERY DAY (Patient not taking:  No sig reported), Disp: 90 tablet, Rfl: 1  Past Medical History: Past Medical History:  Diagnosis Date   Allergy    Coronary artery disease    Erectile dysfunction    Hyperlipidemia    Hypertension    Restless leg syndrome     Tobacco Use: Social History   Tobacco Use  Smoking Status Never  Smokeless Tobacco Never    Labs: Recent Review Flowsheet Data   There is no flowsheet data to display.     Capillary Blood Glucose: No results found for: GLUCAP   Exercise Target Goals: Exercise Program Goal: Individual exercise prescription set using results from initial 6 min walk test and THRR while considering  patient's activity barriers and safety.    Exercise Prescription Goal: Starting with aerobic activity 30 plus minutes a day, 3 days per week for initial exercise prescription. Provide home exercise prescription and guidelines that participant acknowledges understanding prior to discharge.  Activity Barriers & Risk Stratification:  Activity Barriers & Cardiac Risk Stratification - 03/18/21 1334       Activity Barriers & Cardiac Risk Stratification   Activity Barriers None    Cardiac Risk Stratification Low             6 Minute Walk:  6 Minute Walk     Row Name 03/18/21 1357         6 Minute Walk   Phase Initial     Distance 1897 feet     Walk Time 6 minutes     # of Rest Breaks 0     MPH 3.59     METS 4.33     RPE 9     Perceived Dyspnea  0     VO2 Peak 15.16     Symptoms No     Resting HR 76 bpm     Resting BP 110/70     Resting Oxygen Saturation  98 %     Exercise Oxygen Saturation  during 6 min walk 99 %     Max Ex. HR 100 bpm     Max Ex. BP 112/78     2 Minute Post BP 124/72              Oxygen Initial Assessment:   Oxygen Re-Evaluation:   Oxygen Discharge (Final Oxygen Re-Evaluation):   Initial Exercise Prescription:  Initial Exercise Prescription - 03/18/21 1400       Date of Initial Exercise RX and Referring Provider   Date 03/18/21    Referring Provider Dr Tessa Lerner, DO    Expected Discharge Date 05/16/21      Treadmill   MPH 2.6    Grade 0    Minutes 15    METs 2.99      NuStep   Level 3    SPM 85    Minutes 15    METs 3      Prescription Details   Frequency (times per week) 3    Duration Progress to 30 minutes of continuous aerobic without signs/symptoms of physical distress      Intensity   THRR 40-80% of Max Heartrate 62-125    Ratings of Perceived Exertion 11-13    Perceived Dyspnea 0-4      Progression   Progression Continue to progress workloads to maintain intensity without signs/symptoms of physical distress.      Resistance Training   Training  Prescription Yes    Weight 3 lbs    Reps 10-15  Perform Capillary Blood Glucose checks as needed.  Exercise Prescription Changes:   Exercise Comments:   Exercise Goals and Review:   Exercise Goals     Row Name 03/18/21 1324             Exercise Goals   Increase Physical Activity Yes       Intervention Provide advice, education, support and counseling about physical activity/exercise needs.;Develop an individualized exercise prescription for aerobic and resistive training based on initial evaluation findings, risk stratification, comorbidities and participant's personal goals.       Expected Outcomes Short Term: Attend rehab on a regular basis to increase amount of physical activity.;Long Term: Exercising regularly at least 3-5 days a week.;Long Term: Add in home exercise to make exercise part of routine and to increase amount of physical activity.       Increase Strength and Stamina Yes       Intervention Provide advice, education, support and counseling about physical activity/exercise needs.;Develop an individualized exercise prescription for aerobic and resistive training based on initial evaluation findings, risk stratification, comorbidities and participant's personal goals.       Expected Outcomes Short Term: Increase workloads from initial exercise prescription for resistance, speed, and METs.;Short Term: Perform resistance training exercises routinely during rehab and add in resistance training at home;Long Term: Improve cardiorespiratory fitness, muscular endurance and strength as measured by increased METs and functional capacity ( )       Able to understand and use rate of perceived exertion (RPE) scale Yes       Intervention Provide education and explanation on how to use RPE scale       Expected Outcomes Short Term: Able to use RPE daily in rehab to express subjective intensity level;Long Term:  Able to use RPE to guide intensity level when exercising  independently       Knowledge and understanding of Target Heart Rate Range (THRR) Yes       Intervention Provide education and explanation of THRR including how the numbers were predicted and where they are located for reference       Expected Outcomes Short Term: Able to state/look up THRR;Long Term: Able to use THRR to govern intensity when exercising independently;Short Term: Able to use daily as guideline for intensity in rehab       Able to check pulse independently Yes       Intervention Provide education and demonstration on how to check pulse in carotid and radial arteries.;Review the importance of being able to check your own pulse for safety during independent exercise       Expected Outcomes Short Term: Able to explain why pulse checking is important during independent exercise;Long Term: Able to check pulse independently and accurately       Understanding of Exercise Prescription Yes       Intervention Provide education, explanation, and written materials on patient's individual exercise prescription       Expected Outcomes Short Term: Able to explain program exercise prescription;Long Term: Able to explain home exercise prescription to exercise independently                Exercise Goals Re-Evaluation :    Discharge Exercise Prescription (Final Exercise Prescription Changes):   Nutrition:  Target Goals: Understanding of nutrition guidelines, daily intake of sodium 1500mg , cholesterol 200mg , calories 30% from fat and 7% or less from saturated fats, daily to have 5 or more servings of fruits and vegetables.  Biometrics:  Pre Biometrics - 03/18/21 1323  Pre Biometrics   Waist Circumference 35.5 inches    Hip Circumference 38 inches    Waist to Hip Ratio 0.93 %    Triceps Skinfold 10 mm    % Body Fat 22.5 %    Grip Strength 36.5 kg    Flexibility 9.25 in    Single Leg Stand 30 seconds              Nutrition Therapy Plan and Nutrition  Goals:   Nutrition Assessments:  MEDIFICTS Score Key: ?70 Need to make dietary changes  40-70 Heart Healthy Diet ? 40 Therapeutic Level Cholesterol Diet   Picture Your Plate Scores: <86 Unhealthy dietary pattern with much room for improvement. 41-50 Dietary pattern unlikely to meet recommendations for good health and room for improvement. 51-60 More healthful dietary pattern, with some room for improvement.  >60 Healthy dietary pattern, although there may be some specific behaviors that could be improved.    Nutrition Goals Re-Evaluation:   Nutrition Goals Discharge (Final Nutrition Goals Re-Evaluation):   Psychosocial: Target Goals: Acknowledge presence or absence of significant depression and/or stress, maximize coping skills, provide positive support system. Participant is able to verbalize types and ability to use techniques and skills needed for reducing stress and depression.  Initial Review & Psychosocial Screening:  Initial Psych Review & Screening - 03/18/21 1342       Initial Review   Current issues with Current Stress Concerns    Comments Jaysiah admits to having low stress. Kimis in the process of selling his home which he says has been a little stressful      Family Dynamics   Good Support System? Yes   Brendan has his wife for support     Barriers   Psychosocial barriers to participate in program There are no identifiable barriers or psychosocial needs.      Screening Interventions   Interventions Encouraged to exercise             Quality of Life Scores:  Quality of Life - 03/18/21 1449       Quality of Life   Select Quality of Life      Quality of Life Scores   Health/Function Pre 25 %    Socioeconomic Pre 22.93 %    Psych/Spiritual Pre 22.93 %    Family Pre 28.8 %    GLOBAL Pre 24.71 %            Scores of 19 and below usually indicate a poorer quality of life in these areas.  A difference of  2-3 points is a clinically meaningful  difference.  A difference of 2-3 points in the total score of the Quality of Life Index has been associated with significant improvement in overall quality of life, self-image, physical symptoms, and general health in studies assessing change in quality of life.  PHQ-9: Recent Review Flowsheet Data     Depression screen [facility] 2/9 03/18/2021   Decreased Interest 0   Down, Depressed, Hopeless 0   PHQ - 2 Score 0      Interpretation of Total Score  Total Score Depression Severity:  1-4 = Minimal depression, 5-9 = Mild depression, 10-14 = Moderate depression, 15-19 = Moderately severe depression, 20-27 = Severe depression   Psychosocial Evaluation and Intervention:   Psychosocial Re-Evaluation:   Psychosocial Discharge (Final Psychosocial Re-Evaluation):   Vocational Rehabilitation: Provide vocational rehab assistance to qualifying candidates.   Vocational Rehab Evaluation & Intervention:  Vocational Rehab - 03/18/21 1344  Initial Vocational Rehab Evaluation & Intervention   Assessment shows need for Vocational Rehabilitation No   Tharon works full time at [employer] and does not need vocational rehab at this time            Education: Education Goals: Education classes will be provided on a weekly basis, covering required topics. Participant will state understanding/return demonstration of topics presented.  Learning Barriers/Preferences:  Learning Barriers/Preferences - 03/18/21 1345       Learning Barriers/Preferences   Learning Barriers Sight   Taichi wears reading glasses   Learning Preferences Skilled Demonstration             Education Topics: Hypertension, Hypertension Reduction -Define heart disease and high blood pressure. Discus how high blood pressure affects the body and ways to reduce high blood pressure.   Exercise and Your Heart -Discuss why it is important to exercise, the FITT principles of exercise, normal and abnormal responses to exercise,  and how to exercise safely.   Angina -Discuss definition of angina, causes of angina, treatment of angina, and how to decrease risk of having angina.   Cardiac Medications -Review what the following cardiac medications are used for, how they affect the body, and side effects that may occur when taking the medications.  Medications include Aspirin, Beta blockers, calcium channel blockers, ACE Inhibitors, angiotensin receptor blockers, diuretics, digoxin, and antihyperlipidemics.   Congestive Heart Failure -Discuss the definition of CHF, how to live with CHF, the signs and symptoms of CHF, and how keep track of weight and sodium intake.   Heart Disease and Intimacy -Discus the effect sexual activity has on the heart, how changes occur during intimacy as we age, and safety during sexual activity.   Smoking Cessation / COPD -Discuss different methods to quit smoking, the health benefits of quitting smoking, and the definition of COPD.   Nutrition I: Fats -Discuss the types of cholesterol, what cholesterol does to the heart, and how cholesterol levels can be controlled.   Nutrition II: Labels -Discuss the different components of food labels and how to read food label   Heart Parts/Heart Disease and PAD -Discuss the anatomy of the heart, the pathway of blood circulation through the heart, and these are affected by heart disease.   Stress I: Signs and Symptoms -Discuss the causes of stress, how stress may lead to anxiety and depression, and ways to limit stress.   Stress II: Relaxation -Discuss different types of relaxation techniques to limit stress.   Warning Signs of Stroke / TIA -Discuss definition of a stroke, what the signs and symptoms are of a stroke, and how to identify when someone is having stroke.   Knowledge Questionnaire Score:  Knowledge Questionnaire Score - 03/18/21 1449       Knowledge Questionnaire Score   Pre Score 23/24             Core  Components/Risk Factors/Patient Goals at Admission:  Personal Goals and Risk Factors at Admission - 03/18/21 1451       Core Components/Risk Factors/Patient Goals on Admission    Weight Management Yes;Weight Maintenance    Intervention Weight Management: Develop a combined nutrition and exercise program designed to reach desired caloric intake, while maintaining appropriate intake of nutrient and fiber, sodium and fats, and appropriate energy expenditure required for the weight goal.;Weight Management: Provide education and appropriate resources to help participant work on and attain dietary goals.    Expected Outcomes Weight Maintenance: Understanding of the daily nutrition guidelines, which includes  25-35% calories from fat, 7% or less cal from saturated fats, less than 200mg  cholesterol, less than 1.5gm of sodium, & 5 or more servings of fruits and vegetables daily;Understanding recommendations for meals to include 15-35% energy as protein, 25-35% energy from fat, 35-60% energy from carbohydrates, less than 200mg  of dietary cholesterol, 20-35 gm of total fiber daily    Hypertension Yes    Intervention Provide education on lifestyle modifcations including regular physical activity/exercise, weight management, moderate sodium restriction and increased consumption of fresh fruit, vegetables, and low fat dairy, alcohol moderation, and smoking cessation.;Monitor prescription use compliance.    Expected Outcomes Short Term: Continued assessment and intervention until BP is < 140/65mm HG in hypertensive participants. < 130/104mm HG in hypertensive participants with diabetes, heart failure or chronic kidney disease.;Long Term: Maintenance of blood pressure at goal levels.    Lipids Yes    Intervention Provide education and support for participant on nutrition & aerobic/resistive exercise along with prescribed medications to achieve LDL 70mg , HDL >40mg .    Expected Outcomes Short Term: Participant states  understanding of desired cholesterol values and is compliant with medications prescribed. Participant is following exercise prescription and nutrition guidelines.;Long Term: Cholesterol controlled with medications as prescribed, with individualized exercise RX and with personalized nutrition plan. Value goals: LDL < 70mg , HDL > 40 mg.    Stress Yes    Intervention Offer individual and/or small group education and counseling on adjustment to heart disease, stress management and health-related lifestyle change. Teach and support self-help strategies.;Refer participants experiencing significant psychosocial distress to appropriate mental health specialists for further evaluation and treatment. When possible, include family members and significant others in education/counseling sessions.    Expected Outcomes Short Term: Participant demonstrates changes in health-related behavior, relaxation and other stress management skills, ability to obtain effective social support, and compliance with psychotropic medications if prescribed.;Long Term: Emotional wellbeing is indicated by absence of clinically significant psychosocial distress or social isolation.             Core Components/Risk Factors/Patient Goals Review:    Core Components/Risk Factors/Patient Goals at Discharge (Final Review):    ITP Comments:  ITP Comments     Row Name 03/18/21 1323           ITP Comments Medical Director- Dr. 91m, MD                Comments: attended orientation on 03/18/2021 to review rules and guidelines for program.  Completed 6 minute walk test, Intitial ITP, and exercise prescription.  VSS. Telemetry-Sinus Rhythm occasional PVC.  Asymptomatic. Safety measures and social distancing in place per CDC guidelines.Armanda Magic, RN,BSN 03/18/2021 4:07 PM

## 2021-03-21 NOTE — Progress Notes (Signed)
External Labs: Collected: 03/18/2021. Serum creatinine 0.97 mg/dL. eGFR 87 mL/min per 1.73 m Potassium 4.2. AST 21, ALT 25, alkaline phosphatase 83 (all within normal limits). Total cholesterol 78, triglycerides 78, HDL 40, non-HDL 22  TSH 2.39. Hemoglobin 15.5 g/dL, hematocrit 45.9%

## 2021-03-24 ENCOUNTER — Other Ambulatory Visit: Payer: Self-pay

## 2021-03-24 ENCOUNTER — Encounter (HOSPITAL_COMMUNITY)
Admission: RE | Admit: 2021-03-24 | Discharge: 2021-03-24 | Disposition: A | Discharge status: Home / Self Care | Payer: 59 | Source: Ambulatory Visit | Attending: Cardiology | Admitting: Cardiology

## 2021-03-24 DIAGNOSIS — Z48812 Encounter for surgical aftercare following surgery on the circulatory system: Secondary | ICD-10-CM | POA: Insufficient documentation

## 2021-03-24 DIAGNOSIS — Z955 Presence of coronary angioplasty implant and graft: Secondary | ICD-10-CM | POA: Diagnosis not present

## 2021-03-24 NOTE — Progress Notes (Addendum)
Daily Session Note  Patient Details  Name: Cory Hampton MRN: [id number] Date of Birth: 31-Jul-1956 Referring Provider:   Flowsheet Row CARDIAC REHAB PHASE II ORIENTATION from 03/18/2021 in [place]  Referring Provider Dr Rex Kras, DO       Encounter Date: 03/24/2021  Check In:  Session Check In - 03/24/21 1052       Check-In   Supervising physician immediately available to respond to emergencies [facility] immediately available    Physician(s) Dr. Karleen Hampshire    Location [facility]    Staff Present Lesly Rubenstein, MS, ACSM-CEP, CCRP, Exercise Physiologist;Jetta Gilford Rile BS, ACSM EP-C, Exercise Physiologist;Maria Whitaker, RN, Deland Pretty, MS, ACSM CEP, Exercise Physiologist;Kaylee Rosana Hoes, MS, ACSM-CEP, Exercise Physiologist    Virtual Visit No    Medication changes reported     No    Fall or balance concerns reported    No    Tobacco Cessation No Change    Warm-up and Cool-down Performed on first and last piece of equipment    Resistance Training Performed Yes    VAD Patient? No    PAD/SET Patient? No      Pain Assessment   Currently in Pain? No/denies    Pain Score 0-No pain    Multiple Pain Sites No             Capillary Blood Glucose: No results found for this or any previous visit (from the past 24 hour(s)).   Exercise Prescription Changes - 03/24/21 1053       Response to Exercise   Blood Pressure (Admit) 122/68    Blood Pressure (Exercise) 132/78    Blood Pressure (Exit) 110/64    Heart Rate (Admit) 90 bpm    Heart Rate (Exercise) 110 bpm    Heart Rate (Exit) 90 bpm    Rating of Perceived Exertion (Exercise) 11    Symptoms None    Comments Off to a great start with exercise. Increaesd speed on TM from 2.6 to 3.78mph    Duration Continue with 30 min of aerobic exercise without signs/symptoms of physical distress.    Intensity THRR unchanged      Progression   Progression Continue to progress  workloads to maintain intensity without signs/symptoms of physical distress.    Average METs 3.2      Resistance Training   Training Prescription Yes    Weight 3 lbs    Reps 10-15    Time 10 Minutes      Interval Training   Interval Training No      Treadmill   MPH 3   Increased speed at 7 minutes from 2.6 to 3.0 mph.   Grade 0    Minutes 15    METs 3.3      NuStep   Level 3    SPM 85    Minutes 15    METs 3.1             Social History   Tobacco Use  Smoking Status Never  Smokeless Tobacco Never    Goals Met:  Independence with exercise equipment Exercise tolerated well No report of cardiac concerns or symptoms Strength training completed today  Goals Unmet:  Not Applicable  Comments: Patient started cardiac rehab today. Patient oriented to exercise equipment and routine, understanding verbalized. Patient tolerated moderate intensity exercise well without difficulty. Vital signs stable during exercise, normal sinus rhythm on telemetry, asymptomatic   Dr. Fransico Him is Medical Director  for Cardiac Rehab at [facility].

## 2021-03-26 ENCOUNTER — Telehealth (HOSPITAL_COMMUNITY): Payer: Self-pay | Admitting: Family Medicine

## 2021-03-26 ENCOUNTER — Encounter (HOSPITAL_COMMUNITY): Payer: 59

## 2021-03-28 ENCOUNTER — Encounter (HOSPITAL_COMMUNITY)
Admission: RE | Admit: 2021-03-28 | Discharge: 2021-03-28 | Disposition: A | Discharge status: Home / Self Care | Payer: 59 | Source: Ambulatory Visit | Attending: Cardiology | Admitting: Cardiology

## 2021-03-28 ENCOUNTER — Other Ambulatory Visit: Payer: Self-pay

## 2021-03-28 DIAGNOSIS — Z955 Presence of coronary angioplasty implant and graft: Secondary | ICD-10-CM

## 2021-03-28 DIAGNOSIS — Z48812 Encounter for surgical aftercare following surgery on the circulatory system: Secondary | ICD-10-CM | POA: Diagnosis not present

## 2021-03-28 NOTE — Progress Notes (Signed)
Cory Hampton 65 y.o. male Nutrition Note  Diagnosis: CTO RCA s/p PCI/DES  Past Medical History:  Diagnosis Date   Allergy    Coronary artery disease    Erectile dysfunction    Hyperlipidemia    Hypertension    Restless leg syndrome      Medications reviewed.   Current Outpatient Medications:    acetaminophen (TYLENOL) 650 MG CR tablet, Take 650-1,300 mg by mouth every 8 (eight) hours as needed for pain., Disp: , Rfl:    Alirocumab (PRALUENT) 75 MG/ML SOAJ, Inject 1 mL as directed every 14 (fourteen) days., Disp: 1 mL, Rfl: 6   aspirin EC 81 MG tablet, Take 81 mg by mouth at bedtime. Swallow whole., Disp: , Rfl:    cetirizine (ZYRTEC) 10 MG tablet, Take 10 mg by mouth daily as needed for allergies., Disp: , Rfl:    Cholecalciferol (VITAMIN D) 2000 units CAPS, Take 2,000 Units by mouth daily., Disp: , Rfl:    clopidogrel (PLAVIX) 75 MG tablet, Take 1 tablet (75 mg total) by mouth daily with breakfast., Disp: 30 tablet, Rfl: 2   Coenzyme Q10 (COQ10) 200 MG CAPS, Take 200 mg by mouth every evening., Disp: , Rfl:    diclofenac Sodium (VOLTAREN) 1 % GEL, Apply 1 application topically 4 (four) times daily as needed (pain)., Disp: , Rfl:    Evolocumab (REPATHA) 140 MG/ML SOSY, Inject 140 mg into the skin every 14 (fourteen) days. (Patient not taking: No sig reported), Disp: 2 mL, Rfl: 3   ezetimibe (ZETIA) 10 MG tablet, TAKE 1 TABLET BY MOUTH EVERY DAY (Patient not taking: No sig reported), Disp: 90 tablet, Rfl: 1   Flaxseed, Linseed, (FLAXSEED OIL) 1200 MG CAPS, Take 1,200 mg by mouth in the morning and at bedtime. W/Omega 3 540mg , Disp: , Rfl:    Glucos-Chond-Hyal Ac-Ca Fructo (MOVE FREE JOINT HEALTH ADVANCE PO), Take 1 tablet by mouth daily., Disp: , Rfl:    Homeopathic Products [facility] ALLERGY EYE RELIEF OP), Place 1-2 drops into both eyes 3 (three) times daily as needed (dry/allergy eyes)., Disp: , Rfl:    hydrochlorothiazide (HYDRODIURIL) 25 MG tablet, Take 25 mg by mouth in the  morning., Disp: , Rfl:    ketotifen (ZADITOR) 0.025 % ophthalmic solution, Place 1-2 drops into both eyes at bedtime as needed (allergies.)., Disp: , Rfl:    losartan (COZAAR) 25 MG tablet, Take 50 mg by mouth at bedtime., Disp: , Rfl:    MAGNESIUM CITRATE PO, Take 250 mg by mouth every evening., Disp: , Rfl:    metoprolol succinate (TOPROL-XL) 25 MG 24 hr tablet, TAKE 1 TABLET (25 MG TOTAL) BY MOUTH DAILY., Disp: 90 tablet, Rfl: 0   Multiple Vitamin (MULTIVITAMIN WITH MINERALS) TABS tablet, Take 1 tablet by mouth daily., Disp: , Rfl:    nitroGLYCERIN (NITROSTAT) 0.4 MG SL tablet, Place 1 tablet (0.4 mg total) under the tongue every 5 (five) minutes as needed for chest pain. If you require more than two tablets five minutes apart go to the nearest ER via EMS., Disp: 30 tablet, Rfl: 0   omalizumab (XOLAIR) 150 MG injection, Inject into the skin every 28 (twenty-eight) days., Disp: , Rfl:    simvastatin (ZOCOR) 20 MG tablet, Take 1 tablet (20 mg total) by mouth daily at 6 PM., Disp: 90 tablet, Rfl: 0   Ht Readings from Last 1 Encounters:  03/18/21 5' 9.25" (1.759 m)     Wt Readings from Last 3 Encounters:  03/18/21 169 lb 5  oz (76.8 kg)  01/15/21 170 lb 6.4 oz (77.3 kg)  12/31/20 170 lb (77.1 kg)     There is no height or weight on file to calculate BMI.   Social History   Tobacco Use  Smoking Status Never  Smokeless Tobacco Never     No results found for: CHOL No results found for: HDL No results found for: LDLCALC No results found for: TRIG   No results found for: HGBA1C   CBG (last 3)  No results for input(s): GLUCAP in the last 72 hours.   Nutrition Note  Spoke with pt. Nutrition Plan and Nutrition Survey goals reviewed with pt. Pt is following a Heart Healthy diet. He has reduced processed and red meat since PCI. He has started choosing healthier snacks. Overall he feels satisfied with his diet and nutrition knowledge of a heart healthy diet. Per discussion, pt does not  use canned/convenience foods often. Pt does not add salt to food. Pt does not eat out frequently.   Pt expressed understanding of the information reviewed.   Nutrition Diagnosis Food-and nutrition-related knowledge deficit related to lack of exposure to information as related to diagnosis of: ? CVD ?   Nutrition Intervention Pt's individual nutrition plan reviewed with pt. Benefits of adopting Heart Healthy diet discussed when Picture Your Plate reviewed.   Pt given handouts for: ? Nutrition I class ? Nutrition II class ?  Continue client-centered nutrition education by RD, as part of interdisciplinary care.  Goal Pt to build a healthy plate including vegetables, fruits, whole grains, and low-fat dairy products in a heart healthy meal plan.   Plan:  Will provide client-centered nutrition education as part of interdisciplinary care Monitor and evaluate progress toward nutrition goal with team.   Andrey Campanile, MS, RDN, LDN

## 2021-03-31 ENCOUNTER — Encounter (HOSPITAL_COMMUNITY): Payer: 59

## 2021-03-31 ENCOUNTER — Telehealth (HOSPITAL_COMMUNITY): Payer: Self-pay | Admitting: *Deleted

## 2021-03-31 NOTE — Telephone Encounter (Signed)
Patient left voicemail that he will be absent from cardiac rehab on Monday 03/31/21 and Wednesday 04/02/21. Patient plans to return on Friday 04/04/21.  Artist Pais, MS, ACSM [employer] 03/31/21 [phone]

## 2021-04-02 ENCOUNTER — Encounter (HOSPITAL_COMMUNITY): Payer: 59

## 2021-04-04 ENCOUNTER — Encounter (HOSPITAL_COMMUNITY)
Admission: RE | Admit: 2021-04-04 | Discharge: 2021-04-04 | Disposition: A | Discharge status: Home / Self Care | Payer: 59 | Source: Ambulatory Visit | Attending: Cardiology | Admitting: Cardiology

## 2021-04-04 ENCOUNTER — Other Ambulatory Visit: Payer: Self-pay

## 2021-04-04 DIAGNOSIS — Z955 Presence of coronary angioplasty implant and graft: Secondary | ICD-10-CM

## 2021-04-04 DIAGNOSIS — Z48812 Encounter for surgical aftercare following surgery on the circulatory system: Secondary | ICD-10-CM | POA: Diagnosis not present

## 2021-04-04 NOTE — Progress Notes (Signed)
Reviewed home exercise guidelines with patient including endpoints, temperature precautions, target heart rate and rate of perceived exertion. Patient is currently walking outdoors or on his treadmill at home 40 minutes most days of the week as his mode of home exercise. Patient is also stretching and has a recumbent bike at home that he uses a few minutes at time as part of his exercise routine. Patient voices understanding of instructions given.  Artist Pais, MS, ACSM CEP

## 2021-04-07 ENCOUNTER — Telehealth (HOSPITAL_COMMUNITY): Payer: Self-pay | Admitting: *Deleted

## 2021-04-07 ENCOUNTER — Encounter (HOSPITAL_COMMUNITY): Payer: 59

## 2021-04-07 NOTE — Progress Notes (Signed)
Cardiac Individual Treatment Plan  Patient Details  Name: Cory Hampton MRN: [id number] Date of Birth: February 02, 1956 Referring Provider:   Flowsheet Row CARDIAC REHAB PHASE II ORIENTATION from 03/18/2021 in [facility] CARDIAC [facility]  Referring Provider Dr Tessa Lerner, DO       Initial Encounter Date:  Flowsheet Row CARDIAC REHAB PHASE II ORIENTATION from 03/18/2021 in [facility] CARDIAC REHAB  Date 03/18/21       Visit Diagnosis: 12/31/20 CTO RCA s/p PCI DES  Patient's Home Medications on Admission:  Current Outpatient Medications:    acetaminophen (TYLENOL) 650 MG CR tablet, Take 650-1,300 mg by mouth every 8 (eight) hours as needed for pain., Disp: , Rfl:    Alirocumab (PRALUENT) 75 MG/ML SOAJ, Inject 1 mL as directed every 14 (fourteen) days., Disp: 1 mL, Rfl: 6   aspirin EC 81 MG tablet, Take 81 mg by mouth at bedtime. Swallow whole., Disp: , Rfl:    cetirizine (ZYRTEC) 10 MG tablet, Take 10 mg by mouth daily as needed for allergies., Disp: , Rfl:    Cholecalciferol (VITAMIN D) 2000 units CAPS, Take 2,000 Units by mouth daily., Disp: , Rfl:    Coenzyme Q10 (COQ10) 200 MG CAPS, Take 200 mg by mouth every evening., Disp: , Rfl:    diclofenac Sodium (VOLTAREN) 1 % GEL, Apply 1 application topically 4 (four) times daily as needed (pain)., Disp: , Rfl:    Evolocumab (REPATHA) 140 MG/ML SOSY, Inject 140 mg into the skin every 14 (fourteen) days. (Patient not taking: No sig reported), Disp: 2 mL, Rfl: 3   ezetimibe (ZETIA) 10 MG tablet, TAKE 1 TABLET BY MOUTH EVERY DAY (Patient not taking: No sig reported), Disp: 90 tablet, Rfl: 1   Flaxseed, Linseed, (FLAXSEED OIL) 1200 MG CAPS, Take 1,200 mg by mouth in the morning and at bedtime. W/Omega 3 , Disp: , Rfl:    Glucos-Chond-Hyal Ac-Ca Fructo (MOVE FREE JOINT HEALTH ADVANCE PO), Take 1 tablet by mouth daily., Disp: , Rfl:    Homeopathic Products [facility] ALLERGY EYE RELIEF OP), Place 1-2 drops into  both eyes 3 (three) times daily as needed (dry/allergy eyes)., Disp: , Rfl:    hydrochlorothiazide (HYDRODIURIL) 25 MG tablet, Take 25 mg by mouth in the morning., Disp: , Rfl:    ketotifen (ZADITOR) 0.025 % ophthalmic solution, Place 1-2 drops into both eyes at bedtime as needed (allergies.)., Disp: , Rfl:    losartan (COZAAR) 25 MG tablet, Take 50 mg by mouth at bedtime., Disp: , Rfl:    MAGNESIUM CITRATE PO, Take 250 mg by mouth every evening., Disp: , Rfl:    metoprolol succinate (TOPROL-XL) 25 MG 24 hr tablet, TAKE 1 TABLET (25 MG TOTAL) BY MOUTH DAILY., Disp: 90 tablet, Rfl: 0   Multiple Vitamin (MULTIVITAMIN WITH MINERALS) TABS tablet, Take 1 tablet by mouth daily., Disp: , Rfl:    nitroGLYCERIN (NITROSTAT) 0.4 MG SL tablet, Place 1 tablet (0.4 mg total) under the tongue every 5 (five) minutes as needed for chest pain. If you require more than two tablets five minutes apart go to the nearest ER via EMS., Disp: 30 tablet, Rfl: 0   omalizumab (XOLAIR) 150 MG injection, Inject into the skin every 28 (twenty-eight) days., Disp: , Rfl:    simvastatin (ZOCOR) 20 MG tablet, Take 1 tablet (20 mg total) by mouth daily at 6 PM., Disp: 90 tablet, Rfl: 0  Past Medical History: Past Medical History:  Diagnosis Date   Allergy  Coronary artery disease    Erectile dysfunction    Hyperlipidemia    Hypertension    Restless leg syndrome     Tobacco Use: Social History   Tobacco Use  Smoking Status Never  Smokeless Tobacco Never    Labs: Recent Review Flowsheet Data   There is no flowsheet data to display.     Capillary Blood Glucose: No results found for: GLUCAP   Exercise Target Goals: Exercise Program Goal: Individual exercise prescription set using results from initial 6 min walk test and THRR while considering  patient's activity barriers and safety.   Exercise Prescription Goal: Initial exercise prescription builds to 30-45 minutes a day of aerobic activity, 2-3 days per week.   Home exercise guidelines will be given to patient during program as part of exercise prescription that the participant will acknowledge.  Activity Barriers & Risk Stratification:  Activity Barriers & Cardiac Risk Stratification - 03/18/21 1334       Activity Barriers & Cardiac Risk Stratification   Activity Barriers None    Cardiac Risk Stratification Low             6 Minute Walk:  6 Minute Walk     Row Name 03/18/21 1357         6 Minute Walk   Phase Initial     Distance 1897 feet     Walk Time 6 minutes     # of Rest Breaks 0     MPH 3.59     METS 4.33     RPE 9     Perceived Dyspnea  0     VO2 Peak 15.16     Symptoms No     Resting HR 76 bpm     Resting BP 110/70     Resting Oxygen Saturation  98 %     Exercise Oxygen Saturation  during 6 min walk 99 %     Max Ex. HR 100 bpm     Max Ex. BP 112/78     2 Minute Post BP 124/72              Oxygen Initial Assessment:   Oxygen Re-Evaluation:   Oxygen Discharge (Final Oxygen Re-Evaluation):   Initial Exercise Prescription:  Initial Exercise Prescription - 03/18/21 1400       Date of Initial Exercise RX and Referring Provider   Date 03/18/21    Referring Provider Dr Tessa Lerner, DO    Expected Discharge Date 05/16/21      Treadmill   MPH 2.6    Grade 0    Minutes 15    METs 2.99      NuStep   Level 3    SPM 85    Minutes 15    METs 3      Prescription Details   Frequency (times per week) 3    Duration Progress to 30 minutes of continuous aerobic without signs/symptoms of physical distress      Intensity   THRR 40-80% of Max Heartrate 62-125    Ratings of Perceived Exertion 11-13    Perceived Dyspnea 0-4      Progression   Progression Continue to progress workloads to maintain intensity without signs/symptoms of physical distress.      Resistance Training   Training Prescription Yes    Weight 3 lbs    Reps 10-15             Perform Capillary Blood Glucose checks as  needed.  Exercise Prescription Changes:   Exercise Prescription Changes     Row Name 03/24/21 1053 04/04/21 1053           Response to Exercise   Blood Pressure (Admit) 122/68 122/62      Blood Pressure (Exercise) 132/78 138/64      Blood Pressure (Exit) 110/64 118/62      Heart Rate (Admit) 90 bpm 101 bpm      Heart Rate (Exercise) 110 bpm 129 bpm      Heart Rate (Exit) 90 bpm 100 bpm      Rating of Perceived Exertion (Exercise) 11 13      Symptoms None None      Comments Off to a great start with exercise. Increaesd speed on TM from 2.6 to 3.42mph Increased WL on TM and NuStep.      Duration Continue with 30 min of aerobic exercise without signs/symptoms of physical distress. Continue with 30 min of aerobic exercise without signs/symptoms of physical distress.      Intensity THRR unchanged THRR unchanged             Progression   Progression Continue to progress workloads to maintain intensity without signs/symptoms of physical distress. Continue to progress workloads to maintain intensity without signs/symptoms of physical distress.      Average METs 3.2 4.5             Resistance Training   Training Prescription Yes Yes      Weight 3 lbs 4 lbs      Reps 10-15 10-15      Time 10 Minutes 10 Minutes             Interval Training   Interval Training No No             Treadmill   MPH 3  Increased speed at 7 minutes from 2.6 to 3.0 mph. 3.3      Grade 0 3      Minutes 15 15      METs 3.3 4.89             NuStep   Level 3 5      SPM 85 85      Minutes 15 15      METs 3.1 4.2             Home Exercise Plan   Plans to continue exercise at -- Home (comment)  Walking, treadmill, and recumbent bike at home.      Frequency -- Add 4 additional days to program exercise sessions.      Initial Home Exercises Provided -- 04/04/21               Exercise Comments:   Exercise Comments     Row Name 03/24/21 1147 04/04/21 1150         Exercise Comments Patient  tolerated exercise well without symptoms. Increased speed on treadmill from 2.6 to 3.0 mph. Reviewed home exercise guidelines, METs, and goals with patient. Increased incline on TM from 2% to 3%, increase load on NuStep from 4.0 to 5.0, and increased hand weights from 5lbs to 6lbs.. Patient tolerated workload increased well without symptoms.               Exercise Goals and Review:   Exercise Goals     Row Name 03/18/21 1324             Exercise Goals   Increase Physical Activity Yes  Intervention Provide advice, education, support and counseling about physical activity/exercise needs.;Develop an individualized exercise prescription for aerobic and resistive training based on initial evaluation findings, risk stratification, comorbidities and participant's personal goals.       Expected Outcomes Short Term: Attend rehab on a regular basis to increase amount of physical activity.;Long Term: Exercising regularly at least 3-5 days a week.;Long Term: Add in home exercise to make exercise part of routine and to increase amount of physical activity.       Increase Strength and Stamina Yes       Intervention Provide advice, education, support and counseling about physical activity/exercise needs.;Develop an individualized exercise prescription for aerobic and resistive training based on initial evaluation findings, risk stratification, comorbidities and participant's personal goals.       Expected Outcomes Short Term: Increase workloads from initial exercise prescription for resistance, speed, and METs.;Short Term: Perform resistance training exercises routinely during rehab and add in resistance training at home;Long Term: Improve cardiorespiratory fitness, muscular endurance and strength as measured by increased METs and functional capacity ( )       Able to understand and use rate of perceived exertion (RPE) scale Yes       Intervention Provide education and explanation on how to use  RPE scale       Expected Outcomes Short Term: Able to use RPE daily in rehab to express subjective intensity level;Long Term:  Able to use RPE to guide intensity level when exercising independently       Knowledge and understanding of Target Heart Rate Range (THRR) Yes       Intervention Provide education and explanation of THRR including how the numbers were predicted and where they are located for reference       Expected Outcomes Short Term: Able to state/look up THRR;Long Term: Able to use THRR to govern intensity when exercising independently;Short Term: Able to use daily as guideline for intensity in rehab       Able to check pulse independently Yes       Intervention Provide education and demonstration on how to check pulse in carotid and radial arteries.;Review the importance of being able to check your own pulse for safety during independent exercise       Expected Outcomes Short Term: Able to explain why pulse checking is important during independent exercise;Long Term: Able to check pulse independently and accurately       Understanding of Exercise Prescription Yes       Intervention Provide education, explanation, and written materials on patient's individual exercise prescription       Expected Outcomes Short Term: Able to explain program exercise prescription;Long Term: Able to explain home exercise prescription to exercise independently                Exercise Goals Re-Evaluation :  Exercise Goals Re-Evaluation     Row Name 03/24/21 1147 04/04/21 1150           Exercise Goal Re-Evaluation   Exercise Goals Review Increase Physical Activity;Able to understand and use rate of perceived exertion (RPE) scale Increase Physical Activity;Able to understand and use rate of perceived exertion (RPE) scale;Understanding of Exercise Prescription;Increase Strength and Stamina;Knowledge and understanding of Target Heart Rate Range (THRR);Able to check pulse independently      Comments  Patient able to understand and use RPE scale appropriately. Reviewed exercise prescription with patient. Patient is currently walking outdoors or on his treadmill at home 40 minutes most days of the week as  his mode of home exercise. Patient is also stretching and has a recumbent bike at home that he uses a few minutes at time as part of his exercise routine. Patient has a FitBit to monitor his steps and heart rate. Patient's goal is to achieve at least 10,000 steps daily. Patient's goal is to increase workloads at cardiac rehab and to know his parameters, so that he can feel comfortable with home exercise routine.      Expected Outcomes Increase workloads as tolerated to help improve cardiorespiratory fitness. Patient will continue to progress workloads as tolerated.               Discharge Exercise Prescription (Final Exercise Prescription Changes):  Exercise Prescription Changes - 04/04/21 1053       Response to Exercise   Blood Pressure (Admit) 122/62    Blood Pressure (Exercise) 138/64    Blood Pressure (Exit) 118/62    Heart Rate (Admit) 101 bpm    Heart Rate (Exercise) 129 bpm    Heart Rate (Exit) 100 bpm    Rating of Perceived Exertion (Exercise) 13    Symptoms None    Comments Increased WL on TM and NuStep.    Duration Continue with 30 min of aerobic exercise without signs/symptoms of physical distress.    Intensity THRR unchanged      Progression   Progression Continue to progress workloads to maintain intensity without signs/symptoms of physical distress.    Average METs 4.5      Resistance Training   Training Prescription Yes    Weight 4 lbs    Reps 10-15    Time 10 Minutes      Interval Training   Interval Training No      Treadmill   MPH 3.3    Grade 3    Minutes 15    METs 4.89      NuStep   Level 5    SPM 85    Minutes 15    METs 4.2      Home Exercise Plan   Plans to continue exercise at Home (comment)   Walking, treadmill, and recumbent bike at  home.   Frequency Add 4 additional days to program exercise sessions.    Initial Home Exercises Provided 04/04/21             Nutrition:  Target Goals: Understanding of nutrition guidelines, daily intake of sodium 1500mg , cholesterol 200mg , calories 30% from fat and 7% or less from saturated fats, daily to have 5 or more servings of fruits and vegetables.  Biometrics:  Pre Biometrics - 03/18/21 1323       Pre Biometrics   Waist Circumference 35.5 inches    Hip Circumference 38 inches    Waist to Hip Ratio 0.93 %    Triceps Skinfold 10 mm    % Body Fat 22.5 %    Grip Strength 36.5 kg    Flexibility 9.25 in    Single Leg Stand 30 seconds              Nutrition Therapy Plan and Nutrition Goals:  Nutrition Therapy & Goals - 03/28/21 1111       Nutrition Therapy   Diet TLC    Drug/Food Interactions Statins/Certain Fruits      Personal Nutrition Goals   Nutrition Goal Pt to build a healthy plate including vegetables, fruits, whole grains, and low-fat dairy products in a heart healthy meal plan.      Intervention Plan  Intervention Prescribe, educate and counsel regarding individualized specific dietary modifications aiming towards targeted core components such as weight, hypertension, lipid management, diabetes, heart failure and other comorbidities.;Nutrition handout(s) given to patient.    Expected Outcomes Long Term Goal: Adherence to prescribed nutrition plan.;Short Term Goal: Understand basic principles of dietary content, such as calories, fat, sodium, cholesterol and nutrients.             Nutrition Assessments:  MEDIFICTS Score Key: ?70 Need to make dietary changes  40-70 Heart Healthy Diet ? 40 Therapeutic Level Cholesterol Diet   Flowsheet Row CARDIAC REHAB PHASE II EXERCISE from 03/28/2021 in [facility] CARDIAC REHAB  Picture Your Plate Total Score on Admission 63      Picture Your Plate Scores: <51 Unhealthy dietary pattern  with much room for improvement. 41-50 Dietary pattern unlikely to meet recommendations for good health and room for improvement. 51-60 More healthful dietary pattern, with some room for improvement.  >60 Healthy dietary pattern, although there may be some specific behaviors that could be improved.    Nutrition Goals Re-Evaluation:  Nutrition Goals Re-Evaluation     Row Name 03/28/21 1111             Goals   Current Weight 169 lb (76.7 kg)                Nutrition Goals Re-Evaluation:  Nutrition Goals Re-Evaluation     Row Name 03/28/21 1111             Goals   Current Weight 169 lb (76.7 kg)                Nutrition Goals Discharge (Final Nutrition Goals Re-Evaluation):  Nutrition Goals Re-Evaluation - 03/28/21 1111       Goals   Current Weight 169 lb (76.7 kg)             Psychosocial: Target Goals: Acknowledge presence or absence of significant depression and/or stress, maximize coping skills, provide positive support system. Participant is able to verbalize types and ability to use techniques and skills needed for reducing stress and depression.  Initial Review & Psychosocial Screening:  Initial Psych Review & Screening - 03/18/21 1342       Initial Review   Current issues with Current Stress Concerns    Comments Barnet admits to having low stress. Kimis in the process of selling his home which he says has been a little stressful      Family Dynamics   Good Support System? Yes   Thaddeus has his wife for support     Barriers   Psychosocial barriers to participate in program There are no identifiable barriers or psychosocial needs.      Screening Interventions   Interventions Encouraged to exercise             Quality of Life Scores:  Quality of Life - 03/18/21 1449       Quality of Life   Select Quality of Life      Quality of Life Scores   Health/Function Pre 25 %    Socioeconomic Pre 22.93 %    Psych/Spiritual Pre 22.93 %     Family Pre 28.8 %    GLOBAL Pre 24.71 %            Scores of 19 and below usually indicate a poorer quality of life in these areas.  A difference of  2-3 points is a clinically meaningful difference.  A difference  of 2-3 points in the total score of the Quality of Life Index has been associated with significant improvement in overall quality of life, self-image, physical symptoms, and general health in studies assessing change in quality of life.  PHQ-9: Recent Review Flowsheet Data     Depression screen [facility] 2/9 03/18/2021   Decreased Interest 0   Down, Depressed, Hopeless 0   PHQ - 2 Score 0      Interpretation of Total Score  Total Score Depression Severity:  1-4 = Minimal depression, 5-9 = Mild depression, 10-14 = Moderate depression, 15-19 = Moderately severe depression, 20-27 = Severe depression   Psychosocial Evaluation and Intervention:   Psychosocial Re-Evaluation:  Psychosocial Re-Evaluation     Row Name 04/07/21 1725             Psychosocial Re-Evaluation   Current issues with Current Stress Concerns       Comments Kendyn has not voiced any increased stressors since attending phase 2 cardiac rehab       Expected Outcomes Calbert's stress will be decreased or controlled upon completion of phase 2       Interventions Encouraged to attend Cardiac Rehabilitation for the exercise;Stress management education       Continue Psychosocial Services  No Follow up required                Psychosocial Discharge (Final Psychosocial Re-Evaluation):  Psychosocial Re-Evaluation - 04/07/21 1725       Psychosocial Re-Evaluation   Current issues with Current Stress Concerns    Comments Erik has not voiced any increased stressors since attending phase 2 cardiac rehab    Expected Outcomes Anterio's stress will be decreased or controlled upon completion of phase 2    Interventions Encouraged to attend Cardiac Rehabilitation for the exercise;Stress management education    Continue  Psychosocial Services  No Follow up required             Vocational Rehabilitation: Provide vocational rehab assistance to qualifying candidates.   Vocational Rehab Evaluation & Intervention:  Vocational Rehab - 03/18/21 1344       Initial Vocational Rehab Evaluation & Intervention   Assessment shows need for Vocational Rehabilitation No   Dash works full time at [employer] and does not need vocational rehab at this time            Education: Education Goals: Education classes will be provided on a weekly basis, covering required topics. Participant will state understanding/return demonstration of topics presented.  Learning Barriers/Preferences:  Learning Barriers/Preferences - 03/18/21 1345       Learning Barriers/Preferences   Learning Barriers Sight   Gayle wears reading glasses   Learning Preferences Skilled Demonstration             Education Topics: Count Your Pulse:  -Group instruction provided by verbal instruction, demonstration, patient participation and written materials to support subject.  Instructors address importance of being able to find your pulse and how to count your pulse when at home without a heart monitor.  Patients get hands on experience counting their pulse with staff help and individually.   Heart Attack, Angina, and Risk Factor Modification:  -Group instruction provided by verbal instruction, video, and written materials to support subject.  Instructors address signs and symptoms of angina and heart attacks.    Also discuss risk factors for heart disease and how to make changes to improve heart health risk factors.   Functional Fitness:  -Group instruction provided by verbal instruction,  demonstration, patient participation, and written materials to support subject.  Instructors address safety measures for doing things around the house.  Discuss how to get up and down off the floor, how to pick things up properly, how to safely get out of a  chair without assistance, and balance training.   Meditation and Mindfulness:  -Group instruction provided by verbal instruction, patient participation, and written materials to support subject.  Instructor addresses importance of mindfulness and meditation practice to help reduce stress and improve awareness.  Instructor also leads participants through a meditation exercise.    Stretching for Flexibility and Mobility:  -Group instruction provided by verbal instruction, patient participation, and written materials to support subject.  Instructors lead participants through series of stretches that are designed to increase flexibility thus improving mobility.  These stretches are additional exercise for major muscle groups that are typically performed during regular warm up and cool down.   Hands Only CPR:  -Group verbal, video, and participation provides a basic overview of AHA guidelines for community CPR. Role-play of emergencies allow participants the opportunity to practice calling for help and chest compression technique with discussion of AED use.   Hypertension: -Group verbal and written instruction that provides a basic overview of hypertension including the most recent diagnostic guidelines, risk factor reduction with self-care instructions and medication management.    Nutrition I class: Heart Healthy Eating:  -Group instruction provided by PowerPoint slides, verbal discussion, and written materials to support subject matter. The instructor gives an explanation and review of the Therapeutic Lifestyle Changes diet recommendations, which includes a discussion on lipid goals, dietary fat, sodium, fiber, plant stanol/sterol esters, sugar, and the components of a well-balanced, healthy diet.   Nutrition II class: Lifestyle Skills:  -Group instruction provided by PowerPoint slides, verbal discussion, and written materials to support subject matter. The instructor gives an explanation and  review of label reading, grocery shopping for heart health, heart healthy recipe modifications, and ways to make healthier choices when eating out.   Diabetes Question & Answer:  -Group instruction provided by PowerPoint slides, verbal discussion, and written materials to support subject matter. The instructor gives an explanation and review of diabetes co-morbidities, pre- and post-prandial blood glucose goals, pre-exercise blood glucose goals, signs, symptoms, and treatment of hypoglycemia and hyperglycemia, and foot care basics.   Diabetes Blitz:  -Group instruction provided by PowerPoint slides, verbal discussion, and written materials to support subject matter. The instructor gives an explanation and review of the physiology behind type 1 and type 2 diabetes, diabetes medications and rational behind using different medications, pre- and post-prandial blood glucose recommendations and Hemoglobin A1c goals, diabetes diet, and exercise including blood glucose guidelines for exercising safely.    Portion Distortion:  -Group instruction provided by PowerPoint slides, verbal discussion, written materials, and food models to support subject matter. The instructor gives an explanation of serving size versus portion size, changes in portions sizes over the last 20 years, and what consists of a serving from each food group.   Stress Management:  -Group instruction provided by verbal instruction, video, and written materials to support subject matter.  Instructors review role of stress in heart disease and how to cope with stress positively.     Exercising on Your Own:  -Group instruction provided by verbal instruction, power point, and written materials to support subject.  Instructors discuss benefits of exercise, components of exercise, frequency and intensity of exercise, and end points for exercise.  Also discuss use of nitroglycerin and  activating EMS.  Review options of places to exercise  outside of rehab.  Review guidelines for sex with heart disease.   Cardiac Drugs I:  -Group instruction provided by verbal instruction and written materials to support subject.  Instructor reviews cardiac drug classes: antiplatelets, anticoagulants, beta blockers, and statins.  Instructor discusses reasons, side effects, and lifestyle considerations for each drug class.   Cardiac Drugs II:  -Group instruction provided by verbal instruction and written materials to support subject.  Instructor reviews cardiac drug classes: angiotensin converting enzyme inhibitors (ACE-I), angiotensin II receptor blockers (ARBs), nitrates, and calcium channel blockers.  Instructor discusses reasons, side effects, and lifestyle considerations for each drug class.   Anatomy and Physiology of the Circulatory System:  Group verbal and written instruction and models provide basic cardiac anatomy and physiology, with the coronary electrical and arterial systems. Review of: AMI, Angina, Valve disease, Heart Failure, Peripheral Artery Disease, Cardiac Arrhythmia, Pacemakers, and the ICD.   Other Education:  -Group or individual verbal, written, or video instructions that support the educational goals of the cardiac rehab program.   Holiday Eating Survival Tips:  -Group instruction provided by PowerPoint slides, verbal discussion, and written materials to support subject matter. The instructor gives patients tips, tricks, and techniques to help them not only survive but enjoy the holidays despite the onslaught of food that accompanies the holidays.   Knowledge Questionnaire Score:  Knowledge Questionnaire Score - 03/18/21 1449       Knowledge Questionnaire Score   Pre Score 23/24             Core Components/Risk Factors/Patient Goals at Admission:  Personal Goals and Risk Factors at Admission - 03/18/21 1451       Core Components/Risk Factors/Patient Goals on Admission    Weight Management Yes;Weight  Maintenance    Intervention Weight Management: Develop a combined nutrition and exercise program designed to reach desired caloric intake, while maintaining appropriate intake of nutrient and fiber, sodium and fats, and appropriate energy expenditure required for the weight goal.;Weight Management: Provide education and appropriate resources to help participant work on and attain dietary goals.    Expected Outcomes Weight Maintenance: Understanding of the daily nutrition guidelines, which includes 25-35% calories from fat, 7% or less cal from saturated fats, less than 200mg  cholesterol, less than 1.5gm of sodium, & 5 or more servings of fruits and vegetables daily;Understanding recommendations for meals to include 15-35% energy as protein, 25-35% energy from fat, 35-60% energy from carbohydrates, less than 200mg  of dietary cholesterol, 20-35 gm of total fiber daily    Hypertension Yes    Intervention Provide education on lifestyle modifcations including regular physical activity/exercise, weight management, moderate sodium restriction and increased consumption of fresh fruit, vegetables, and low fat dairy, alcohol moderation, and smoking cessation.;Monitor prescription use compliance.    Expected Outcomes Short Term: Continued assessment and intervention until BP is < 140/53mm HG in hypertensive participants. < 130/59mm HG in hypertensive participants with diabetes, heart failure or chronic kidney disease.;Long Term: Maintenance of blood pressure at goal levels.    Lipids Yes    Intervention Provide education and support for participant on nutrition & aerobic/resistive exercise along with prescribed medications to achieve LDL 70mg , HDL >40mg .    Expected Outcomes Short Term: Participant states understanding of desired cholesterol values and is compliant with medications prescribed. Participant is following exercise prescription and nutrition guidelines.;Long Term: Cholesterol controlled with medications as  prescribed, with individualized exercise RX and with personalized nutrition plan. Value goals: LDL <  , HDL > 40 mg.    Stress Yes    Intervention Offer individual and/or small group education and counseling on adjustment to heart disease, stress management and health-related lifestyle change. Teach and support self-help strategies.;Refer participants experiencing significant psychosocial distress to appropriate mental health specialists for further evaluation and treatment. When possible, include family members and significant others in education/counseling sessions.    Expected Outcomes Short Term: Participant demonstrates changes in health-related behavior, relaxation and other stress management skills, ability to obtain effective social support, and compliance with psychotropic medications if prescribed.;Long Term: Emotional wellbeing is indicated by absence of clinically significant psychosocial distress or social isolation.             Core Components/Risk Factors/Patient Goals Review:   Goals and Risk Factor Review     Row Name 04/07/21 1729             Core Components/Risk Factors/Patient Goals Review   Personal Goals Review Weight Management/Obesity;Stress;Lipids;Hypertension       Review Conn is doing well with exercise when in attendance vital signs have been stable. Attendance has been sporadic due to work and [employer].       Expected Outcomes Payden will continue to partcipate in phase 2 cardiac rehab for exercise, nutrition and lifestyle modifications.                Core Components/Risk Factors/Patient Goals at Discharge (Final Review):   Goals and Risk Factor Review - 04/07/21 1729       Core Components/Risk Factors/Patient Goals Review   Personal Goals Review Weight Management/Obesity;Stress;Lipids;Hypertension    Review Blas is doing well with exercise when in attendance vital signs have been stable. Attendance has been sporadic due to work and  [employer].    Expected Outcomes Rose will continue to partcipate in phase 2 cardiac rehab for exercise, nutrition and lifestyle modifications.             ITP Comments:  ITP Comments     Row Name 03/18/21 1323 04/07/21 1724         ITP Comments Medical Director- Dr. Armanda Magic, MD 30 Day ITP Review. Daelin has good partcipation when in attendance at phase 2 CR. Binh will be absent the next two weeks for vacation               Comments: See ITP Comments

## 2021-04-07 NOTE — Telephone Encounter (Signed)
Patient left message on voicemail. He will be absent from cardiac rehab today. Patient scheduled to be on vacation the rest of this week and next week.   Artist Pais, MS, ACSM CEP 04/07/21 [phone]

## 2021-04-09 ENCOUNTER — Encounter (HOSPITAL_COMMUNITY): Payer: 59

## 2021-04-11 ENCOUNTER — Encounter (HOSPITAL_COMMUNITY): Payer: 59

## 2021-04-14 ENCOUNTER — Encounter (HOSPITAL_COMMUNITY): Payer: 59

## 2021-04-16 ENCOUNTER — Other Ambulatory Visit: Payer: Self-pay | Admitting: Cardiology

## 2021-04-16 ENCOUNTER — Encounter (HOSPITAL_COMMUNITY): Payer: 59

## 2021-04-17 ENCOUNTER — Ambulatory Visit: Payer: 59 | Admitting: Cardiology

## 2021-04-18 ENCOUNTER — Encounter (HOSPITAL_COMMUNITY): Payer: 59

## 2021-04-20 ENCOUNTER — Other Ambulatory Visit: Payer: Self-pay | Admitting: Cardiology

## 2021-04-21 ENCOUNTER — Telehealth (HOSPITAL_COMMUNITY): Payer: Self-pay | Admitting: *Deleted

## 2021-04-21 ENCOUNTER — Ambulatory Visit: Payer: 59 | Admitting: Cardiology

## 2021-04-21 ENCOUNTER — Other Ambulatory Visit: Payer: Self-pay

## 2021-04-21 ENCOUNTER — Encounter: Payer: Self-pay | Admitting: Cardiology

## 2021-04-21 ENCOUNTER — Encounter (HOSPITAL_COMMUNITY): Payer: 59

## 2021-04-21 VITALS — BP 120/76 | HR 73 | Temp 97.8°F | Resp 16 | Ht 69.0 in | Wt 171.0 lb

## 2021-04-21 DIAGNOSIS — Z955 Presence of coronary angioplasty implant and graft: Secondary | ICD-10-CM

## 2021-04-21 DIAGNOSIS — I10 Essential (primary) hypertension: Secondary | ICD-10-CM

## 2021-04-21 DIAGNOSIS — I2510 Atherosclerotic heart disease of native coronary artery without angina pectoris: Secondary | ICD-10-CM

## 2021-04-21 DIAGNOSIS — E7800 Pure hypercholesterolemia, unspecified: Secondary | ICD-10-CM

## 2021-04-21 DIAGNOSIS — N529 Male erectile dysfunction, unspecified: Secondary | ICD-10-CM

## 2021-04-21 NOTE — Telephone Encounter (Signed)
Patient left message on department voicemail, will be out this week. Plans to return Wednesday, 04/30/21.

## 2021-04-21 NOTE — Progress Notes (Signed)
Date:  04/21/2021   ID:  Cory Hampton, DOB 07-May-1956, MRN [id number]  PCP:  Jonathon Jordan, MD  Cardiologist:  Rex Kras, DO, [facility] (established care 12/23/2020) Former Cardiology Providers: Dr. Einar Gip   Date: 04/21/21 Last Office Visit: 01/15/2021   Chief Complaint  Patient presents with   Coronary Artery Disease   Hyperlipidemia   Follow-up    HPI  Cory Hampton is a 65 y.o. male who presents to the office with a chief complaint of " follow-up for CAD and hyperlipidemia.." Patient's past medical history and cardiovascular risk factors include: Established coronary disease status post intervention, hypertension, hyperlipidemia, ED, restless leg syndrome.   He is referred to the office at the request of Dr. Linna Darner for evaluation of chest pain.  During initial consultation patient symptoms very suggestive of angina pectoris and underwent an ischemic evaluation.  He underwent left heart catheterization was noted to have a CTO of the RCA and disease involving the LAD distribution which was hemodynamically insignificant due to invasive hemodynamics.  He underwent angioplasty with successful stenting to the RCA with TIMI-3 flow postintervention and residual stenosis was 0%.  He was kept overnight for observation and later discharged home.  At last office visit patient states that he was doing well and back to regular exercising on his treadmill at 3.5 to 4 mph.  And he was on maximally tolerated doses of simvastatin and Zetia and LDL remained 142 mg/dL.  Given the recent coronary intervention and residual disease in the LAD that shared decision was to start Praluent at the last office visit.  He presents for follow-up.  He denies any chest pain or shortness of breath at rest or with effort related activities.  His functional status remains relatively stable.  He has started cardiac rehab since last office visit.  He is tolerating Praluent well and repeat fasting lipid profile  notes excellent.   FUNCTIONAL STATUS: Walk 4 miles a day or 10,000 steps a day.    ALLERGIES: Allergies  Allergen Reactions   Lisinopril Hives   Statins Other (See Comments)    Weakness, joint aches    MEDICATION LIST PRIOR TO VISIT: Current Meds  Medication Sig   Alirocumab (PRALUENT) 75 MG/ML SOAJ Inject 1 mL as directed every 14 (fourteen) days.   aspirin EC 81 MG tablet Take 81 mg by mouth at bedtime. Swallow whole.   cetirizine (ZYRTEC) 10 MG tablet Take 10 mg by mouth daily as needed for allergies.   Cholecalciferol (VITAMIN D) 2000 units CAPS Take 2,000 Units by mouth daily.   clopidogrel (PLAVIX) 75 MG tablet TAKE 1 TABLET BY MOUTH EVERY DAY   Coenzyme Q10 (COQ10) 200 MG CAPS Take 200 mg by mouth every evening.   diclofenac Sodium (VOLTAREN) 1 % GEL Apply 1 application topically 4 (four) times daily as needed (pain).   Flaxseed, Linseed, (FLAXSEED OIL) 1200 MG CAPS Take 1,200 mg by mouth in the morning and at bedtime. W/Omega 3 $RemoveB'540mg'YkWdkFwK$    Glucos-Chond-Hyal Ac-Ca Fructo (MOVE FREE JOINT HEALTH ADVANCE PO) Take 1 tablet by mouth daily.   Homeopathic Products [facility] ALLERGY EYE RELIEF OP) Place 1-2 drops into both eyes 3 (three) times daily as needed (dry/allergy eyes).   hydrochlorothiazide (HYDRODIURIL) 25 MG tablet Take 25 mg by mouth in the morning.   ketotifen (ZADITOR) 0.025 % ophthalmic solution Place 1-2 drops into both eyes at bedtime as needed (allergies.).   losartan (COZAAR) 25 MG tablet Take 50 mg by mouth at bedtime.  MAGNESIUM CITRATE PO Take 250 mg by mouth every evening.   metoprolol succinate (TOPROL-XL) 25 MG 24 hr tablet TAKE 1 TABLET (25 MG TOTAL) BY MOUTH DAILY.   Multiple Vitamin (MULTIVITAMIN WITH MINERALS) TABS tablet Take 1 tablet by mouth daily.   nitroGLYCERIN (NITROSTAT) 0.4 MG SL tablet Place 1 tablet (0.4 mg total) under the tongue every 5 (five) minutes as needed for chest pain. If you require more than two tablets five minutes apart go to the  nearest ER via EMS.   omalizumab (XOLAIR) 150 MG injection Inject into the skin every 28 (twenty-eight) days.   simvastatin (ZOCOR) 20 MG tablet TAKE 1 TABLET BY MOUTH EVERY DAY     PAST MEDICAL HISTORY: Past Medical History:  Diagnosis Date   Allergy    Coronary artery disease    Erectile dysfunction    Hyperlipidemia    Hypertension    Restless leg syndrome     PAST SURGICAL HISTORY: Past Surgical History:  Procedure Laterality Date   CARDIAC CATHETERIZATION     COLONOSCOPY     CORONARY CTO INTERVENTION N/A 12/31/2020   Procedure: CORONARY CTO INTERVENTION;  Surgeon: Adrian Prows, MD;  Location: [place] [facility];  Service: Cardiovascular;  Laterality: N/A;   CORONARY STENT INTERVENTION N/A 12/31/2020   Procedure: CORONARY STENT INTERVENTION;  Surgeon: Adrian Prows, MD;  Location: [place] [facility];  Service: Cardiovascular;  Laterality: N/A;   FOREIGN BODY REMOVAL ABDOMINAL     age 80   INTRAVASCULAR PRESSURE WIRE/FFR STUDY N/A 12/31/2020   Procedure: INTRAVASCULAR PRESSURE WIRE/FFR STUDY;  Surgeon: Adrian Prows, MD;  Location: [place] [facility];  Service: Cardiovascular;  Laterality: N/A;   LEFT HEART CATH AND CORONARY ANGIOGRAPHY N/A 12/31/2020   Procedure: LEFT HEART CATH AND CORONARY ANGIOGRAPHY;  Surgeon: Adrian Prows, MD;  Location: [place] [facility];  Service: Cardiovascular;  Laterality: N/A;   WISDOM TOOTH EXTRACTION      FAMILY HISTORY: The patient family history includes Dementia in his father; Hypertension in his sister; Stroke in his father and mother.  SOCIAL HISTORY:  The patient  reports that he has never smoked. He has never used smokeless tobacco. He reports current alcohol use of about 4.0 standard drinks per week. He reports that he does not use drugs.  REVIEW OF SYSTEMS: Review of Systems  Constitutional: Negative for chills and fever.  HENT:  Negative for hoarse voice and nosebleeds.   Eyes:  Negative for discharge, double vision and pain.   Cardiovascular:  Negative for chest pain, claudication, dyspnea on exertion, leg swelling, near-syncope, orthopnea, palpitations, paroxysmal nocturnal dyspnea and syncope.  Respiratory:  Negative for hemoptysis and shortness of breath.   Musculoskeletal:  Negative for muscle cramps and myalgias.  Gastrointestinal:  Negative for abdominal pain, constipation, diarrhea, hematemesis, hematochezia, melena, nausea and vomiting.  Neurological:  Negative for dizziness and light-headedness.   PHYSICAL EXAM: Vitals with BMI 04/21/2021 03/18/2021 01/15/2021  Height $Remov'5\' 9"'LJGMQJ$  5' 9.25" $Remove'5\' 9"'YgRjcnH$   Weight 171 lbs 169 lbs 5 oz 170 lbs 6 oz  BMI 25.24 15.72 62.03  Systolic 559 741 638  Diastolic 76 70 75  Pulse 73 76 71    CONSTITUTIONAL: Well-developed and well-nourished. No acute distress.  SKIN: Skin is warm and dry. No rash noted. No cyanosis. No pallor. No jaundice HEAD: Normocephalic and atraumatic.  EYES: No scleral icterus MOUTH/THROAT: Moist oral membranes.  NECK: No JVD present. No thyromegaly noted. No carotid bruits  LYMPHATIC: No visible cervical adenopathy.  CHEST  Normal respiratory effort. No intercostal retractions  LUNGS: Clear to auscultation bilaterally.  No stridor. No wheezes. No rales.  CARDIOVASCULAR: Regular rate and rhythm, positive S1-S2, no murmurs rubs or gallops appreciated. ABDOMINAL: No apparent ascites.  EXTREMITIES: No peripheral edema  HEMATOLOGIC: No significant bruising NEUROLOGIC: Oriented to person, place, and time. Nonfocal. Normal muscle tone.  PSYCHIATRIC: Normal mood and affect. Normal behavior. Cooperative  CARDIAC DATABASE: EKG: 12/23/2020: Normal sinus rhythm, 85 bpm, normal axis, left atrial enlargement, without underlying ischemia or injury pattern.  01/15/2021: Normal sinus rhythm, 68 bpm, normal axis, without underlying ischemia or injury pattern.  Echocardiogram: 11/06/2020: LVEF 55 to 60%, no regional wall motion abnormalities, normal diastolic function,  trivial MR.  Stress Testing: 11/12/2020 MPI: Nuclear stress EF: 63%. The left ventricular ejection fraction is normal (55-65%). There was no ST segment deviation noted during stress. No T wave inversion was noted during stress. Defect 1: There is a small defect of moderate severity present in the apex location. Findings consistent with ischemia. However, there is normal wall motion. Cannot rule out apical thinning. This is a low risk study  Heart Catheterization: 12/31/2020: LV: Low LVEDP at 6 mmHg.  There was no pressure gradient across the aortic valve. LM: It is mildly diseased, mild calcification evident, mid segment has a 20% stenosis. LAD: Diffusely diseased, proximal to mid segment has mild to moderate amount of calcification.  There are multiple tandem 30 to 60% stenosis.  D1 and D2 are small to moderate-sized. LAD gives contralateral collaterals to the RCA. RFR to proximal and mid LAD = 0.84, at most equivocal. CX: Very minimal disease.  Large OM1. RCA: CTO proximal segment.  Mild to moderate amount of calcification evident in the RCA in the midsegment.  Intervention data: Successful CTO PCI with implantation of 2.5 x 28 mm Synergy DES which was postdilated with 2.75 x 15 mm  Sapphire at 18 atmospheric pressure giving 2.83 mm vessel.  Stenosis reduced from 100% to 0% with improvement in TIMI 0 to TIMI-3 flow. 150 mL contrast utilized.   Recommendation: Patient be discharged home in the morning, will need aggressive risk factor modification, he has statin intolerance, but is willing to try low-dose statin along with ezetimibe for now.  We will reevaluate this.  The LAD in the proximal to mid segment has diffuse tandem 50% stenosis took more 60% stenosis, RFR was negative for significant ischemia, needs aggressive risk factor modification and medical therapy.   LABORATORY DATA: CBC Latest Ref Rng & Units 01/01/2021  WBC 4.0 - 10.5 K/uL 8.2  Hemoglobin 13.0 - 17.0 g/dL 14.4   Hematocrit 39.0 - 52.0 % 42.8  Platelets 150 - 400 K/uL 247    CMP Latest Ref Rng & Units 01/01/2021  Glucose 70 - 99 mg/dL 104(H)  BUN 8 - 23 mg/dL 20  Creatinine 0.61 - 1.24 mg/dL 0.95  Sodium 135 - 145 mmol/L 138  Potassium 3.5 - 5.1 mmol/L 3.3(L)  Chloride 98 - 111 mmol/L 102  CO2 22 - 32 mmol/L 28  Calcium 8.9 - 10.3 mg/dL 9.2    Lipid Panel  No results found for: CHOL, TRIG, HDL, CHOLHDL, VLDL, LDLCALC, LDLDIRECT, LABVLDL  No components found for: NTPROBNP No results for input(s): PROBNP in the last 8760 hours. No results for input(s): TSH in the last 8760 hours.  BMP Recent Labs    01/01/21 0109  NA 138  K 3.3*  CL 102  CO2 28  GLUCOSE 104*  BUN 20  CREATININE 0.95  CALCIUM 9.2  GFRNONAA >60    HEMOGLOBIN A1C No results found for: HGBA1C, MPG   External Labs: Collected: 12/26/2020 Lipid profile: Total cholesterol 204, triglycerides 114, HDL 41,  LDL 142.   External Labs: Collected: 03/18/2021. Serum creatinine 0.97 mg/dL. eGFR 87 mL/min per 1.73 m Potassium 4.2. AST 21, ALT 25, alkaline phosphatase 83 (all within normal limits). Total cholesterol 78, triglycerides 78, HDL 40, non-HDL 22  TSH 2.39. Hemoglobin 15.5 g/dL, hematocrit 45.9%  IMPRESSION:    ICD-10-CM   1. Atherosclerosis of native coronary artery of native heart without angina pectoris  I25.10     2. History of coronary angioplasty with insertion of stent  Z95.5     3. Hypertension, essential  I10     4. Pure hypercholesterolemia  E78.00     5. Erectile dysfunction, unspecified erectile dysfunction type  N52.9        RECOMMENDATIONS: Cory Hampton is a 65 y.o. male whose past medical history and cardiac risk factors include: Established coronary disease status post intervention, hypertension, hyperlipidemia, ED, restless leg syndrome.   Atherosclerosis of the native coronary artery with prior intervention and without angina: Patient denies any chest pain at rest or with  effort related activities. No use of sublingual nitroglycerin tablets. Participates in cardiac rehab. Has done well with Praluent, repeat lipid profile independently reviewed and noted above.   Would recommend total 6 months of dual antiplatelet therapy. Medications reconciled.  Pure hypercholesterolemia: Outside labs independently reviewed 02/2021 Despite being on maximally tolerated Zetia and simvastatin is LDL levels remained 142 mg/dL  Given his underlying CAD with prior interventions that shared decision was to initiate Repatha at the last visit.   He has responded very well to Praluent.  Most recent lipid profile from 02/2021 reviewed.  Benign essential hypertension: Office blood pressures are within excellent control Medications reconciled. Low-salt diet recommended.  Erectile dysfunction: Currently on Viagra on as needed basis.  FINAL MEDICATION LIST END OF ENCOUNTER: No orders of the defined types were placed in this encounter.   Medications Discontinued During This Encounter  Medication Reason   Evolocumab (REPATHA) 140 MG/ML SOSY Error   ezetimibe (ZETIA) 10 MG tablet Error     Current Outpatient Medications:    Alirocumab (PRALUENT) 75 MG/ML SOAJ, Inject 1 mL as directed every 14 (fourteen) days., Disp: 1 mL, Rfl: 6   aspirin EC 81 MG tablet, Take 81 mg by mouth at bedtime. Swallow whole., Disp: , Rfl:    cetirizine (ZYRTEC) 10 MG tablet, Take 10 mg by mouth daily as needed for allergies., Disp: , Rfl:    Cholecalciferol (VITAMIN D) 2000 units CAPS, Take 2,000 Units by mouth daily., Disp: , Rfl:    clopidogrel (PLAVIX) 75 MG tablet, TAKE 1 TABLET BY MOUTH EVERY DAY, Disp: 90 tablet, Rfl: 3   Coenzyme Q10 (COQ10) 200 MG CAPS, Take 200 mg by mouth every evening., Disp: , Rfl:    diclofenac Sodium (VOLTAREN) 1 % GEL, Apply 1 application topically 4 (four) times daily as needed (pain)., Disp: , Rfl:    Flaxseed, Linseed, (FLAXSEED OIL) 1200 MG CAPS, Take 1,200 mg by mouth  in the morning and at bedtime. W/Omega 3 $RemoveB'540mg'Pfgdvscc$ , Disp: , Rfl:    Glucos-Chond-Hyal Ac-Ca Fructo (MOVE FREE JOINT HEALTH ADVANCE PO), Take 1 tablet by mouth daily., Disp: , Rfl:    Homeopathic Products [facility] ALLERGY EYE RELIEF OP), Place 1-2 drops into both eyes 3 (three) times daily as needed (dry/allergy eyes)., Disp: , Rfl:  hydrochlorothiazide (HYDRODIURIL) 25 MG tablet, Take 25 mg by mouth in the morning., Disp: , Rfl:    ketotifen (ZADITOR) 0.025 % ophthalmic solution, Place 1-2 drops into both eyes at bedtime as needed (allergies.)., Disp: , Rfl:    losartan (COZAAR) 25 MG tablet, Take 50 mg by mouth at bedtime., Disp: , Rfl:    MAGNESIUM CITRATE PO, Take 250 mg by mouth every evening., Disp: , Rfl:    metoprolol succinate (TOPROL-XL) 25 MG 24 hr tablet, TAKE 1 TABLET (25 MG TOTAL) BY MOUTH DAILY., Disp: 90 tablet, Rfl: 0   Multiple Vitamin (MULTIVITAMIN WITH MINERALS) TABS tablet, Take 1 tablet by mouth daily., Disp: , Rfl:    nitroGLYCERIN (NITROSTAT) 0.4 MG SL tablet, Place 1 tablet (0.4 mg total) under the tongue every 5 (five) minutes as needed for chest pain. If you require more than two tablets five minutes apart go to the nearest ER via EMS., Disp: 30 tablet, Rfl: 0   omalizumab (XOLAIR) 150 MG injection, Inject into the skin every 28 (twenty-eight) days., Disp: , Rfl:    simvastatin (ZOCOR) 20 MG tablet, TAKE 1 TABLET BY MOUTH EVERY DAY, Disp: 90 tablet, Rfl: 0   acetaminophen (TYLENOL) 650 MG CR tablet, Take 650-1,300 mg by mouth every 8 (eight) hours as needed for pain., Disp: , Rfl:   No orders of the defined types were placed in this encounter.   There are no Patient Instructions on file for this visit.   --Continue cardiac medications as reconciled in final medication list. --Return in about 6 months (around 10/21/2021) for Follow up, CAD. Or sooner if needed. --Continue follow-up with your primary care physician regarding the management of your other chronic comorbid  conditions.  Patient's questions and concerns were addressed to his satisfaction. He voices understanding of the instructions provided during this encounter.   This note was created using a voice recognition software as a result there may be grammatical errors inadvertently enclosed that do not reflect the nature of this encounter. Every attempt is made to correct such errors.  Rex Kras, [place], [facility]  Pager: [phone] Office: [phone]

## 2021-04-23 ENCOUNTER — Encounter (HOSPITAL_COMMUNITY): Payer: 59

## 2021-04-25 ENCOUNTER — Encounter (HOSPITAL_COMMUNITY): Payer: 59

## 2021-04-30 ENCOUNTER — Encounter (HOSPITAL_COMMUNITY)
Admission: RE | Admit: 2021-04-30 | Discharge: 2021-04-30 | Disposition: A | Discharge status: Home / Self Care | Payer: 59 | Source: Ambulatory Visit | Attending: Cardiology | Admitting: Cardiology

## 2021-04-30 ENCOUNTER — Other Ambulatory Visit: Payer: Self-pay

## 2021-04-30 DIAGNOSIS — Z955 Presence of coronary angioplasty implant and graft: Secondary | ICD-10-CM | POA: Diagnosis not present

## 2021-05-02 ENCOUNTER — Encounter (HOSPITAL_COMMUNITY): Payer: 59

## 2021-05-05 ENCOUNTER — Encounter (HOSPITAL_COMMUNITY)
Admission: RE | Admit: 2021-05-05 | Discharge: 2021-05-05 | Disposition: A | Discharge status: Home / Self Care | Payer: 59 | Source: Ambulatory Visit | Attending: Cardiology | Admitting: Cardiology

## 2021-05-05 ENCOUNTER — Other Ambulatory Visit: Payer: Self-pay

## 2021-05-05 DIAGNOSIS — Z955 Presence of coronary angioplasty implant and graft: Secondary | ICD-10-CM

## 2021-05-05 NOTE — Progress Notes (Signed)
Cardiac Individual Treatment Plan  Patient Details  Name: Cory Hampton MRN: [id number] Date of Birth: 1955-10-23 Referring Provider:   Flowsheet Row CARDIAC REHAB PHASE II ORIENTATION from 03/18/2021 in [facility] CARDIAC [facility]  Referring Provider Dr Tessa Lerner, DO       Initial Encounter Date:  Flowsheet Row CARDIAC REHAB PHASE II ORIENTATION from 03/18/2021 in [facility] CARDIAC REHAB  Date 03/18/21       Visit Diagnosis: 12/31/20 CTO RCA s/p PCI DES  Patient's Home Medications on Admission:  Current Outpatient Medications:    acetaminophen (TYLENOL) 650 MG CR tablet, Take 650-1,300 mg by mouth every 8 (eight) hours as needed for pain., Disp: , Rfl:    Alirocumab (PRALUENT) 75 MG/ML SOAJ, Inject 1 mL as directed every 14 (fourteen) days., Disp: 1 mL, Rfl: 6   aspirin EC 81 MG tablet, Take 81 mg by mouth at bedtime. Swallow whole., Disp: , Rfl:    cetirizine (ZYRTEC) 10 MG tablet, Take 10 mg by mouth daily as needed for allergies., Disp: , Rfl:    Cholecalciferol (VITAMIN D) 2000 units CAPS, Take 2,000 Units by mouth daily., Disp: , Rfl:    clopidogrel (PLAVIX) 75 MG tablet, TAKE 1 TABLET BY MOUTH EVERY DAY, Disp: 90 tablet, Rfl: 3   Coenzyme Q10 (COQ10) 200 MG CAPS, Take 200 mg by mouth every evening., Disp: , Rfl:    diclofenac Sodium (VOLTAREN) 1 % GEL, Apply 1 application topically 4 (four) times daily as needed (pain)., Disp: , Rfl:    Flaxseed, Linseed, (FLAXSEED OIL) 1200 MG CAPS, Take 1,200 mg by mouth in the morning and at bedtime. W/Omega 3 540mg , Disp: , Rfl:    Glucos-Chond-Hyal Ac-Ca Fructo (MOVE FREE JOINT HEALTH ADVANCE PO), Take 1 tablet by mouth daily., Disp: , Rfl:    Homeopathic Products [facility] ALLERGY EYE RELIEF OP), Place 1-2 drops into both eyes 3 (three) times daily as needed (dry/allergy eyes)., Disp: , Rfl:    hydrochlorothiazide (HYDRODIURIL) 25 MG tablet, Take 25 mg by mouth in the morning., Disp: , Rfl:     ketotifen (ZADITOR) 0.025 % ophthalmic solution, Place 1-2 drops into both eyes at bedtime as needed (allergies.)., Disp: , Rfl:    losartan (COZAAR) 25 MG tablet, Take 50 mg by mouth at bedtime., Disp: , Rfl:    MAGNESIUM CITRATE PO, Take 250 mg by mouth every evening., Disp: , Rfl:    metoprolol succinate (TOPROL-XL) 25 MG 24 hr tablet, TAKE 1 TABLET (25 MG TOTAL) BY MOUTH DAILY., Disp: 90 tablet, Rfl: 0   Multiple Vitamin (MULTIVITAMIN WITH MINERALS) TABS tablet, Take 1 tablet by mouth daily., Disp: , Rfl:    nitroGLYCERIN (NITROSTAT) 0.4 MG SL tablet, Place 1 tablet (0.4 mg total) under the tongue every 5 (five) minutes as needed for chest pain. If you require more than two tablets five minutes apart go to the nearest ER via EMS., Disp: 30 tablet, Rfl: 0   omalizumab (XOLAIR) 150 MG injection, Inject into the skin every 28 (twenty-eight) days., Disp: , Rfl:    simvastatin (ZOCOR) 20 MG tablet, TAKE 1 TABLET BY MOUTH EVERY DAY, Disp: 90 tablet, Rfl: 0  Past Medical History: Past Medical History:  Diagnosis Date   Allergy    Coronary artery disease    Erectile dysfunction    Hyperlipidemia    Hypertension    Restless leg syndrome     Tobacco Use: Social History   Tobacco Use  Smoking Status Never  Smokeless Tobacco Never    Labs: Recent Review Flowsheet Data   There is no flowsheet data to display.     Capillary Blood Glucose: No results found for: GLUCAP   Exercise Target Goals: Exercise Program Goal: Individual exercise prescription set using results from initial 6 min walk test and THRR while considering  patient's activity barriers and safety.   Exercise Prescription Goal: Initial exercise prescription builds to 30-45 minutes a day of aerobic activity, 2-3 days per week.  Home exercise guidelines will be given to patient during program as part of exercise prescription that the participant will acknowledge.  Activity Barriers & Risk Stratification:  Activity  Barriers & Cardiac Risk Stratification - 03/18/21 1334       Activity Barriers & Cardiac Risk Stratification   Activity Barriers None    Cardiac Risk Stratification Low             6 Minute Walk:  6 Minute Walk     Row Name 03/18/21 1357         6 Minute Walk   Phase Initial     Distance 1897 feet     Walk Time 6 minutes     # of Rest Breaks 0     MPH 3.59     METS 4.33     RPE 9     Perceived Dyspnea  0     VO2 Peak 15.16     Symptoms No     Resting HR 76 bpm     Resting BP 110/70     Resting Oxygen Saturation  98 %     Exercise Oxygen Saturation  during 6 min walk 99 %     Max Ex. HR 100 bpm     Max Ex. BP 112/78     2 Minute Post BP 124/72              Oxygen Initial Assessment:   Oxygen Re-Evaluation:   Oxygen Discharge (Final Oxygen Re-Evaluation):   Initial Exercise Prescription:  Initial Exercise Prescription - 03/18/21 1400       Date of Initial Exercise RX and Referring Provider   Date 03/18/21    Referring Provider Dr Tessa Lerner, DO    Expected Discharge Date 05/16/21      Treadmill   MPH 2.6    Grade 0    Minutes 15    METs 2.99      NuStep   Level 3    SPM 85    Minutes 15    METs 3      Prescription Details   Frequency (times per week) 3    Duration Progress to 30 minutes of continuous aerobic without signs/symptoms of physical distress      Intensity   THRR 40-80% of Max Heartrate 62-125    Ratings of Perceived Exertion 11-13    Perceived Dyspnea 0-4      Progression   Progression Continue to progress workloads to maintain intensity without signs/symptoms of physical distress.      Resistance Training   Training Prescription Yes    Weight 3 lbs    Reps 10-15             Perform Capillary Blood Glucose checks as needed.  Exercise Prescription Changes:   Exercise Prescription Changes     Row Name 03/24/21 1053 04/04/21 1053 04/30/21 1055 05/05/21 1054       Response to Exercise   Blood Pressure  (Admit) 122/68 122/62 118/60  110/62    Blood Pressure (Exercise) 132/78 138/64 128/72 158/80    Blood Pressure (Exit) 110/64 118/62 100/72 112/72    Heart Rate (Admit) 90 bpm 101 bpm 97 bpm 91 bpm    Heart Rate (Exercise) 110 bpm 129 bpm 116 bpm 132 bpm    Heart Rate (Exit) 90 bpm 100 bpm 84 bpm 99 bpm    Rating of Perceived Exertion (Exercise) Symptoms None None None None    Comments Off to a great start with exercise. Increaesd speed on TM from 2.6 to 3.27mph Increased WL on TM and NuStep. -- Increased speed and incline on TM, load on SE.    Duration Continue with 30 min of aerobic exercise without signs/symptoms of physical distress. Continue with 30 min of aerobic exercise without signs/symptoms of physical distress. Continue with 30 min of aerobic exercise without signs/symptoms of physical distress. Continue with 30 min of aerobic exercise without signs/symptoms of physical distress.    Intensity THRR unchanged THRR unchanged THRR unchanged THRR unchanged         Progression   Progression Continue to progress workloads to maintain intensity without signs/symptoms of physical distress. Continue to progress workloads to maintain intensity without signs/symptoms of physical distress. Continue to progress workloads to maintain intensity without signs/symptoms of physical distress. Continue to progress workloads to maintain intensity without signs/symptoms of physical distress.    Average METs 3.2 4.5 4.6 5.5         Resistance Training   Training Prescription Yes Yes No Yes    Weight 3 lbs 4 lbs -- 6 lbs    Reps 10-15 10-15 -- 10-15    Time 10 Minutes 10 Minutes -- 10 Minutes         Interval Training   Interval Training No No No No         Treadmill   MPH 3  Increased speed at 7 minutes from 2.6 to 3.0 mph. 3.3 3.3 3.5    Grade 0 Minutes METs 3.3 4.89 4.89 6.09         NuStep   Level SPM 85 85 85 85    Minutes METs 3.1 4.2 4.3 5         Home Exercise Plan   Plans to continue exercise at -- Home (comment)  Walking, treadmill, and recumbent bike at home. Home (comment)  Walking, treadmill, and recumbent bike at home. Home (comment)  Walking, treadmill, and recumbent bike at home.    Frequency -- Add 4 additional days to program exercise sessions. Add 4 additional days to program exercise sessions. Add 4 additional days to program exercise sessions.    Initial Home Exercises Provided -- 04/04/21 04/04/21 04/04/21             Exercise Comments:   Exercise Comments     Row Name 03/24/21 1147 04/04/21 1150 05/05/21 1050       Exercise Comments Patient tolerated exercise well without symptoms. Increased speed on treadmill from 2.6 to 3.0 mph. Reviewed home exercise guidelines, METs, and goals with patient. Increased incline on TM from 2% to 3%, increase load on NuStep from 4.0 to 5.0, and increased hand weights from 5lbs to 6lbs.. Patient tolerated workload increased well without symptoms. Reviewed METs and goals with patient. Increased speed and  incline on TM and load on SE and tolerated well.              Exercise Goals and Review:   Exercise Goals     Row Name 03/18/21 1324             Exercise Goals   Increase Physical Activity Yes       Intervention Provide advice, education, support and counseling about physical activity/exercise needs.;Develop an individualized exercise prescription for aerobic and resistive training based on initial evaluation findings, risk stratification, comorbidities and participant's personal goals.       Expected Outcomes Short Term: Attend rehab on a regular basis to increase amount of physical activity.;Long Term: Exercising regularly at least 3-5 days a week.;Long Term: Add in home exercise to make exercise part of routine and to increase amount of physical activity.       Increase Strength and Stamina Yes       Intervention Provide advice, education,  support and counseling about physical activity/exercise needs.;Develop an individualized exercise prescription for aerobic and resistive training based on initial evaluation findings, risk stratification, comorbidities and participant's personal goals.       Expected Outcomes Short Term: Increase workloads from initial exercise prescription for resistance, speed, and METs.;Short Term: Perform resistance training exercises routinely during rehab and add in resistance training at home;Long Term: Improve cardiorespiratory fitness, muscular endurance and strength as measured by increased METs and functional capacity ( )       Able to understand and use rate of perceived exertion (RPE) scale Yes       Intervention Provide education and explanation on how to use RPE scale       Expected Outcomes Short Term: Able to use RPE daily in rehab to express subjective intensity level;Long Term:  Able to use RPE to guide intensity level when exercising independently       Knowledge and understanding of Target Heart Rate Range (THRR) Yes       Intervention Provide education and explanation of THRR including how the numbers were predicted and where they are located for reference       Expected Outcomes Short Term: Able to state/look up THRR;Long Term: Able to use THRR to govern intensity when exercising independently;Short Term: Able to use daily as guideline for intensity in rehab       Able to check pulse independently Yes       Intervention Provide education and demonstration on how to check pulse in carotid and radial arteries.;Review the importance of being able to check your own pulse for safety during independent exercise       Expected Outcomes Short Term: Able to explain why pulse checking is important during independent exercise;Long Term: Able to check pulse independently and accurately       Understanding of Exercise Prescription Yes       Intervention Provide education, explanation, and written materials  on patient's individual exercise prescription       Expected Outcomes Short Term: Able to explain program exercise prescription;Long Term: Able to explain home exercise prescription to exercise independently                Exercise Goals Re-Evaluation :  Exercise Goals Re-Evaluation     Row Name 03/24/21 1147 04/04/21 1150 05/05/21 1050         Exercise Goal Re-Evaluation   Exercise Goals Review Increase Physical Activity;Able to understand and use rate of perceived exertion (RPE) scale Increase Physical Activity;Able to understand  and use rate of perceived exertion (RPE) scale;Understanding of Exercise Prescription;Increase Strength and Stamina;Knowledge and understanding of Target Heart Rate Range (THRR);Able to check pulse independently Increase Physical Activity;Able to understand and use rate of perceived exertion (RPE) scale;Understanding of Exercise Prescription;Increase Strength and Stamina;Knowledge and understanding of Target Heart Rate Range (THRR);Able to check pulse independently     Comments Patient able to understand and use RPE scale appropriately. Reviewed exercise prescription with patient. Patient is currently walking outdoors or on his treadmill at home 40 minutes most days of the week as his mode of home exercise. Patient is also stretching and has a recumbent bike at home that he uses a few minutes at time as part of his exercise routine. Patient has a FitBit to monitor his steps and heart rate. Patient's goal is to achieve at least 10,000 steps daily. Patient's goal is to increase workloads at cardiac rehab and to know his parameters, so that he can feel comfortable with home exercise routine. Patient is progressing well with exercise. He feels his strength and stamina are pretty much back to where they were prior to his cardiac event. Patient is exercising most days of the week. Patient will return to curling practice this Wednesday, which was one of his goals.     Expected  Outcomes Increase workloads as tolerated to help improve cardiorespiratory fitness. Patient will continue to progress workloads as tolerated. Patient will continue current exercise routine to help continue to build strength and stamina.              Discharge Exercise Prescription (Final Exercise Prescription Changes):  Exercise Prescription Changes - 05/05/21 1054       Response to Exercise   Blood Pressure (Admit) 110/62    Blood Pressure (Exercise) 158/80    Blood Pressure (Exit) 112/72    Heart Rate (Admit) 91 bpm    Heart Rate (Exercise) 132 bpm    Heart Rate (Exit) 99 bpm    Rating of Perceived Exertion (Exercise) 14    Symptoms None    Comments Increased speed and incline on TM, load on SE.    Duration Continue with 30 min of aerobic exercise without signs/symptoms of physical distress.    Intensity THRR unchanged      Progression   Progression Continue to progress workloads to maintain intensity without signs/symptoms of physical distress.    Average METs 5.5      Resistance Training   Training Prescription Yes    Weight 6 lbs    Reps 10-15    Time 10 Minutes      Interval Training   Interval Training No      Treadmill   MPH 3.5    Grade 5    Minutes 20    METs 6.09      NuStep   Level 6    SPM 85    Minutes 15    METs 5      Home Exercise Plan   Plans to continue exercise at Home (comment)   Walking, treadmill, and recumbent bike at home.   Frequency Add 4 additional days to program exercise sessions.    Initial Home Exercises Provided 04/04/21             Nutrition:  Target Goals: Understanding of nutrition guidelines, daily intake of sodium 1500mg , cholesterol 200mg , calories 30% from fat and 7% or less from saturated fats, daily to have 5 or more servings of fruits and vegetables.  Biometrics:  Pre  Biometrics - 03/18/21 1323       Pre Biometrics   Waist Circumference 35.5 inches    Hip Circumference 38 inches    Waist to Hip Ratio  0.93 %    Triceps Skinfold 10 mm    % Body Fat 22.5 %    Grip Strength 36.5 kg    Flexibility 9.25 in    Single Leg Stand 30 seconds              Nutrition Therapy Plan and Nutrition Goals:  Nutrition Therapy & Goals - 03/28/21 1111       Nutrition Therapy   Diet TLC    Drug/Food Interactions Statins/Certain Fruits      Personal Nutrition Goals   Nutrition Goal Pt to build a healthy plate including vegetables, fruits, whole grains, and low-fat dairy products in a heart healthy meal plan.      Intervention Plan   Intervention Prescribe, educate and counsel regarding individualized specific dietary modifications aiming towards targeted core components such as weight, hypertension, lipid management, diabetes, heart failure and other comorbidities.;Nutrition handout(s) given to patient.    Expected Outcomes Long Term Goal: Adherence to prescribed nutrition plan.;Short Term Goal: Understand basic principles of dietary content, such as calories, fat, sodium, cholesterol and nutrients.             Nutrition Assessments:  MEDIFICTS Score Key: ?70 Need to make dietary changes  40-70 Heart Healthy Diet ? 40 Therapeutic Level Cholesterol Diet   Flowsheet Row CARDIAC REHAB PHASE II EXERCISE from 03/28/2021 in [facility] CARDIAC REHAB  Picture Your Plate Total Score on Admission 63      Picture Your Plate Scores: <45 Unhealthy dietary pattern with much room for improvement. 41-50 Dietary pattern unlikely to meet recommendations for good health and room for improvement. 51-60 More healthful dietary pattern, with some room for improvement.  >60 Healthy dietary pattern, although there may be some specific behaviors that could be improved.    Nutrition Goals Re-Evaluation:  Nutrition Goals Re-Evaluation     Row Name 03/28/21 1111 05/05/21 0946           Goals   Current Weight 169 lb (76.7 kg) 170 lb 13.7 oz (77.5 kg)      Nutrition Goal -- Pt to build a  healthy plate including vegetables, fruits, whole grains, and low-fat dairy products in a heart healthy meal plan.               Nutrition Goals Re-Evaluation:  Nutrition Goals Re-Evaluation     Row Name 03/28/21 1111 05/05/21 0946           Goals   Current Weight 169 lb (76.7 kg) 170 lb 13.7 oz (77.5 kg)      Nutrition Goal -- Pt to build a healthy plate including vegetables, fruits, whole grains, and low-fat dairy products in a heart healthy meal plan.               Nutrition Goals Discharge (Final Nutrition Goals Re-Evaluation):  Nutrition Goals Re-Evaluation - 05/05/21 0946       Goals   Current Weight 170 lb 13.7 oz (77.5 kg)    Nutrition Goal Pt to build a healthy plate including vegetables, fruits, whole grains, and low-fat dairy products in a heart healthy meal plan.             Psychosocial: Target Goals: Acknowledge presence or absence of significant depression and/or stress, maximize coping skills, provide positive support  system. Participant is able to verbalize types and ability to use techniques and skills needed for reducing stress and depression.  Initial Review & Psychosocial Screening:  Initial Psych Review & Screening - 03/18/21 1342       Initial Review   Current issues with Current Stress Concerns    Comments Selena BattenKim admits to having low stress. Kimis in the process of selling his home which he says has been a little stressful      Family Dynamics   Good Support System? Yes   Selena BattenKim has his wife for support     Barriers   Psychosocial barriers to participate in program There are no identifiable barriers or psychosocial needs.      Screening Interventions   Interventions Encouraged to exercise             Quality of Life Scores:  Quality of Life - 03/18/21 1449       Quality of Life   Select Quality of Life      Quality of Life Scores   Health/Function Pre 25 %    Socioeconomic Pre 22.93 %    Psych/Spiritual Pre 22.93 %     Family Pre 28.8 %    GLOBAL Pre 24.71 %            Scores of 19 and below usually indicate a poorer quality of life in these areas.  A difference of  2-3 points is a clinically meaningful difference.  A difference of 2-3 points in the total score of the Quality of Life Index has been associated with significant improvement in overall quality of life, self-image, physical symptoms, and general health in studies assessing change in quality of life.  PHQ-9: Recent Review Flowsheet Data     [facility]   Decreased Interest 0   Down, Depressed, Hopeless 0   PHQ - 2 Score 0      Interpretation of Total Score  Total Score Depression Severity:  1-4 = Minimal depression, 5-9 = Mild depression, 10-14 = Moderate depression, 15-19 = Moderately severe depression, 20-27 = Severe depression   Psychosocial Evaluation and Intervention:   Psychosocial Re-Evaluation:  Psychosocial Re-Evaluation     Row Name 04/07/21 1725 05/05/21 1710           Psychosocial Re-Evaluation   Current issues with Current Stress Concerns Current Stress Concerns      Comments Selena BattenKim has not voiced any increased stressors since attending phase 2 cardiac rehab Selena BattenKim has not voiced any increased stressors since attending phase 2 cardiac rehab      Expected Outcomes Kiptyn's stress will be decreased or controlled upon completion of phase 2 Cottrell's stress will be decreased or controlled upon completion of phase 2      Interventions Encouraged to attend Cardiac Rehabilitation for the exercise;Stress management education Encouraged to attend Cardiac Rehabilitation for the exercise;Stress management education      Continue Psychosocial Services  No Follow up required No Follow up required             Initial Review   Source of Stress Concerns -- Family               Psychosocial Discharge (Final Psychosocial Re-Evaluation):  Psychosocial Re-Evaluation - 05/05/21 1710       Psychosocial  Re-Evaluation   Current issues with Current Stress Concerns    Comments Selena BattenKim has not voiced any increased stressors since attending phase 2 cardiac rehab    Expected Outcomes  Norvell's stress will be decreased or controlled upon completion of phase 2    Interventions Encouraged to attend Cardiac Rehabilitation for the exercise;Stress management education    Continue Psychosocial Services  No Follow up required      Initial Review   Source of Stress Concerns Family             Vocational Rehabilitation: Provide vocational rehab assistance to qualifying candidates.   Vocational Rehab Evaluation & Intervention:  Vocational Rehab - 03/18/21 1344       Initial Vocational Rehab Evaluation & Intervention   Assessment shows need for Vocational Rehabilitation No   Karon works full time at [employer] and does not need vocational rehab at this time            Education: Education Goals: Education classes will be provided on a weekly basis, covering required topics. Participant will state understanding/return demonstration of topics presented.  Learning Barriers/Preferences:  Learning Barriers/Preferences - 03/18/21 1345       Learning Barriers/Preferences   Learning Barriers Sight   Chancelor wears reading glasses   Learning Preferences Skilled Demonstration             Education Topics: Count Your Pulse:  -Group instruction provided by verbal instruction, demonstration, patient participation and written materials to support subject.  Instructors address importance of being able to find your pulse and how to count your pulse when at home without a heart monitor.  Patients get hands on experience counting their pulse with staff help and individually.   Heart Attack, Angina, and Risk Factor Modification:  -Group instruction provided by verbal instruction, video, and written materials to support subject.  Instructors address signs and symptoms of angina and heart attacks.    Also discuss  risk factors for heart disease and how to make changes to improve heart health risk factors.   Functional Fitness:  -Group instruction provided by verbal instruction, demonstration, patient participation, and written materials to support subject.  Instructors address safety measures for doing things around the house.  Discuss how to get up and down off the floor, how to pick things up properly, how to safely get out of a chair without assistance, and balance training.   Meditation and Mindfulness:  -Group instruction provided by verbal instruction, patient participation, and written materials to support subject.  Instructor addresses importance of mindfulness and meditation practice to help reduce stress and improve awareness.  Instructor also leads participants through a meditation exercise.    Stretching for Flexibility and Mobility:  -Group instruction provided by verbal instruction, patient participation, and written materials to support subject.  Instructors lead participants through series of stretches that are designed to increase flexibility thus improving mobility.  These stretches are additional exercise for major muscle groups that are typically performed during regular warm up and cool down.   Hands Only CPR:  -Group verbal, video, and participation provides a basic overview of AHA guidelines for community CPR. Role-play of emergencies allow participants the opportunity to practice calling for help and chest compression technique with discussion of AED use.   Hypertension: -Group verbal and written instruction that provides a basic overview of hypertension including the most recent diagnostic guidelines, risk factor reduction with self-care instructions and medication management.    Nutrition I class: Heart Healthy Eating:  -Group instruction provided by PowerPoint slides, verbal discussion, and written materials to support subject matter. The instructor gives an explanation and  review of the Therapeutic Lifestyle Changes diet recommendations, which includes a  discussion on lipid goals, dietary fat, sodium, fiber, plant stanol/sterol esters, sugar, and the components of a well-balanced, healthy diet.   Nutrition II class: Lifestyle Skills:  -Group instruction provided by PowerPoint slides, verbal discussion, and written materials to support subject matter. The instructor gives an explanation and review of label reading, grocery shopping for heart health, heart healthy recipe modifications, and ways to make healthier choices when eating out.   Diabetes Question & Answer:  -Group instruction provided by PowerPoint slides, verbal discussion, and written materials to support subject matter. The instructor gives an explanation and review of diabetes co-morbidities, pre- and post-prandial blood glucose goals, pre-exercise blood glucose goals, signs, symptoms, and treatment of hypoglycemia and hyperglycemia, and foot care basics.   Diabetes Blitz:  -Group instruction provided by PowerPoint slides, verbal discussion, and written materials to support subject matter. The instructor gives an explanation and review of the physiology behind type 1 and type 2 diabetes, diabetes medications and rational behind using different medications, pre- and post-prandial blood glucose recommendations and Hemoglobin A1c goals, diabetes diet, and exercise including blood glucose guidelines for exercising safely.    Portion Distortion:  -Group instruction provided by PowerPoint slides, verbal discussion, written materials, and food models to support subject matter. The instructor gives an explanation of serving size versus portion size, changes in portions sizes over the last 20 years, and what consists of a serving from each food group.   Stress Management:  -Group instruction provided by verbal instruction, video, and written materials to support subject matter.  Instructors review role of stress  in heart disease and how to cope with stress positively.     Exercising on Your Own:  -Group instruction provided by verbal instruction, power point, and written materials to support subject.  Instructors discuss benefits of exercise, components of exercise, frequency and intensity of exercise, and end points for exercise.  Also discuss use of nitroglycerin and activating EMS.  Review options of places to exercise outside of rehab.  Review guidelines for sex with heart disease.   Cardiac Drugs I:  -Group instruction provided by verbal instruction and written materials to support subject.  Instructor reviews cardiac drug classes: antiplatelets, anticoagulants, beta blockers, and statins.  Instructor discusses reasons, side effects, and lifestyle considerations for each drug class.   Cardiac Drugs II:  -Group instruction provided by verbal instruction and written materials to support subject.  Instructor reviews cardiac drug classes: angiotensin converting enzyme inhibitors (ACE-I), angiotensin II receptor blockers (ARBs), nitrates, and calcium channel blockers.  Instructor discusses reasons, side effects, and lifestyle considerations for each drug class.   Anatomy and Physiology of the Circulatory System:  Group verbal and written instruction and models provide basic cardiac anatomy and physiology, with the coronary electrical and arterial systems. Review of: AMI, Angina, Valve disease, Heart Failure, Peripheral Artery Disease, Cardiac Arrhythmia, Pacemakers, and the ICD.   Other Education:  -Group or individual verbal, written, or video instructions that support the educational goals of the cardiac rehab program.   Holiday Eating Survival Tips:  -Group instruction provided by PowerPoint slides, verbal discussion, and written materials to support subject matter. The instructor gives patients tips, tricks, and techniques to help them not only survive but enjoy the holidays despite the onslaught  of food that accompanies the holidays.   Knowledge Questionnaire Score:  Knowledge Questionnaire Score - 03/18/21 1449       Knowledge Questionnaire Score   Pre Score 23/24  Core Components/Risk Factors/Patient Goals at Admission:  Personal Goals and Risk Factors at Admission - 03/18/21 1451       Core Components/Risk Factors/Patient Goals on Admission    Weight Management Yes;Weight Maintenance    Intervention Weight Management: Develop a combined nutrition and exercise program designed to reach desired caloric intake, while maintaining appropriate intake of nutrient and fiber, sodium and fats, and appropriate energy expenditure required for the weight goal.;Weight Management: Provide education and appropriate resources to help participant work on and attain dietary goals.    Expected Outcomes Weight Maintenance: Understanding of the daily nutrition guidelines, which includes 25-35% calories from fat, 7% or less cal from saturated fats, less than  cholesterol, less than 1.5gm of sodium, & 5 or more servings of fruits and vegetables daily;Understanding recommendations for meals to include 15-35% energy as protein, 25-35% energy from fat, 35-60% energy from carbohydrates, less than  of dietary cholesterol, 20-35 gm of total fiber daily    Hypertension Yes    Intervention Provide education on lifestyle modifcations including regular physical activity/exercise, weight management, moderate sodium restriction and increased consumption of fresh fruit, vegetables, and low fat dairy, alcohol moderation, and smoking cessation.;Monitor prescription use compliance.    Expected Outcomes Short Term: Continued assessment and intervention until BP is < 140/24mm HG in hypertensive participants. < 130/54mm HG in hypertensive participants with diabetes, heart failure or chronic kidney disease.;Long Term: Maintenance of blood pressure at goal levels.    Lipids Yes    Intervention  Provide education and support for participant on nutrition & aerobic/resistive exercise along with prescribed medications to achieve LDL 70mg , HDL >40mg .    Expected Outcomes Short Term: Participant states understanding of desired cholesterol values and is compliant with medications prescribed. Participant is following exercise prescription and nutrition guidelines.;Long Term: Cholesterol controlled with medications as prescribed, with individualized exercise RX and with personalized nutrition plan. Value goals: LDL < , HDL > 40 mg.    Stress Yes    Intervention Offer individual and/or small group education and counseling on adjustment to heart disease, stress management and health-related lifestyle change. Teach and support self-help strategies.;Refer participants experiencing significant psychosocial distress to appropriate mental health specialists for further evaluation and treatment. When possible, include family members and significant others in education/counseling sessions.    Expected Outcomes Short Term: Participant demonstrates changes in health-related behavior, relaxation and other stress management skills, ability to obtain effective social support, and compliance with psychotropic medications if prescribed.;Long Term: Emotional wellbeing is indicated by absence of clinically significant psychosocial distress or social isolation.             Core Components/Risk Factors/Patient Goals Review:   Goals and Risk Factor Review     Row Name 04/07/21 1729 05/05/21 1710           Core Components/Risk Factors/Patient Goals Review   Personal Goals Review Weight Management/Obesity;Stress;Lipids;Hypertension Weight Management/Obesity;Stress;Lipids;Hypertension      Review Rondey is doing well with exercise when in attendance vital signs have been stable. Attendance has been sporadic due to work and [employer]. Aarit continues to do well  exercise when in attendance vital signs have  been stable. Attendance has been sporadic due to work and [employer]. Kojo will complete phase 2 cardiac rehab next week.      Expected Outcomes Jorje will continue to partcipate in phase 2 cardiac rehab for exercise, nutrition and lifestyle modifications. Bobbi will continue to partcipate in phase 2 cardiac rehab for exercise, nutrition and lifestyle modifications.  Core Components/Risk Factors/Patient Goals at Discharge (Final Review):   Goals and Risk Factor Review - 05/05/21 1710       Core Components/Risk Factors/Patient Goals Review   Personal Goals Review Weight Management/Obesity;Stress;Lipids;Hypertension    Review Olman continues to do well  exercise when in attendance vital signs have been stable. Attendance has been sporadic due to work and [employer]. Julen will complete phase 2 cardiac rehab next week.    Expected Outcomes Lavere will continue to partcipate in phase 2 cardiac rehab for exercise, nutrition and lifestyle modifications.             ITP Comments:  ITP Comments     Row Name 03/18/21 1323 04/07/21 1724 05/05/21 1709       ITP Comments Medical Director- Dr. Armanda Magic, MD 30 Day ITP Review. Sailor has good partcipation when in attendance at phase 2 CR. Yanis will be absent the next two weeks for vacation 30 Day ITP Review. Raegan has good partcipation when in attendance at phase 2 CR. Yerik complete phase 2 cardiac rehab next week.              Comments: See ITP Comments

## 2021-05-07 ENCOUNTER — Encounter (HOSPITAL_COMMUNITY): Payer: 59

## 2021-05-09 ENCOUNTER — Encounter (HOSPITAL_COMMUNITY): Payer: 59

## 2021-05-09 ENCOUNTER — Telehealth (HOSPITAL_COMMUNITY): Payer: Self-pay | Admitting: *Deleted

## 2021-05-09 NOTE — Telephone Encounter (Signed)
Patient called out for today and Friday 05/16/21. Patient plans to attend on Monday 05/12/21.

## 2021-05-12 ENCOUNTER — Encounter (HOSPITAL_COMMUNITY)
Admission: RE | Admit: 2021-05-12 | Discharge: 2021-05-12 | Disposition: A | Discharge status: Home / Self Care | Payer: 59 | Source: Ambulatory Visit | Attending: Cardiology | Admitting: Cardiology

## 2021-05-12 ENCOUNTER — Other Ambulatory Visit: Payer: Self-pay

## 2021-05-12 VITALS — BP 110/62 | HR 81 | Ht 69.25 in | Wt 170.6 lb

## 2021-05-12 DIAGNOSIS — Z955 Presence of coronary angioplasty implant and graft: Secondary | ICD-10-CM | POA: Diagnosis not present

## 2021-05-12 NOTE — Progress Notes (Signed)
Discharge Progress Report  Patient Details  Name: Cory Hampton MRN: [id number] Date of Birth: 1956/07/06 Referring Provider:   Flowsheet Row CARDIAC REHAB PHASE II ORIENTATION from 03/18/2021 in [facility]  Referring Provider Dr Rex Kras, DO        Number of Visits: 6  Reason for Discharge:  Patient reached a stable level of exercise. Patient independent in their exercise. Patient has met program and personal goals. 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  ADL UCSD:   Initial Exercise Prescription:  Initial Exercise Prescription - 03/18/21 1400       Date of Initial Exercise RX and Referring Provider   Date 03/18/21    Referring Provider Dr Rex Kras, DO    Expected Discharge Date 05/16/21      Treadmill   MPH 2.6    Grade 0    Minutes 15    METs 2.99      NuStep   Level 3    SPM 85    Minutes 15    METs 3      Prescription Details   Frequency (times per week) 3    Duration Progress to 30 minutes of continuous aerobic without signs/symptoms of physical distress      Intensity   THRR 40-80% of Max Heartrate 62-125    Ratings of Perceived Exertion 11-13    Perceived Dyspnea 0-4      Progression   Progression Continue to progress workloads to maintain intensity without signs/symptoms of physical distress.      Resistance Training   Training Prescription Yes    Weight 3 lbs    Reps 10-15             Discharge Exercise Prescription (Final Exercise Prescription Changes):  Exercise Prescription Changes - 05/12/21 1053       Response to Exercise   Blood Pressure (Admit) 110/62    Blood Pressure (Exercise) 138/62    Blood Pressure (Exit) 98/62    Heart Rate (Admit) 81 bpm    Heart Rate (Exercise) 122 bpm    Heart Rate (Exit) 81 bpm    Rating of Perceived Exertion (Exercise) 14     Symptoms None    Comments Completed cardiac rehab, 6MWT performed.    Duration Continue with 30 min of aerobic exercise without signs/symptoms of physical distress.    Intensity THRR unchanged      Progression   Progression Continue to progress workloads to maintain intensity without signs/symptoms of physical distress.    Average METs 5.5      Resistance Training   Training Prescription Yes    Weight 6 lbs    Reps 10-15    Time 10 Minutes      Interval Training   Interval Training No      Treadmill   MPH 3.5    Grade 5    Minutes 20    METs 6.09      NuStep   Level 6    SPM 85    Minutes 15    METs 5      Track   Laps 11   2176 feet   Minutes 6   6-minute walk test   METs 4.9      Home Exercise Plan   Plans to continue exercise at  Home (comment)   Walking, treadmill, and recumbent bike at home.   Frequency Add 4 additional days to program exercise sessions.    Initial Home Exercises Provided 04/04/21             Functional Capacity:  6 Minute Walk     Row Name 03/18/21 1357 05/12/21 1047       6 Minute Walk   Phase Initial Discharge    Distance 1897 feet 2176 feet    Distance % Change -- 14.71 %    Distance Feet Change -- 279 ft    Walk Time 6 minutes 6 minutes    # of Rest Breaks 0 0    MPH 3.59 4.12    METS 4.33 4.9    RPE 9 13    Perceived Dyspnea  0 0    VO2 Peak 15.16 17.17    Symptoms No No    Resting HR 76 bpm 81 bpm    Resting BP 110/70 110/62    Resting Oxygen Saturation  98 % --    Exercise Oxygen Saturation  during 6 min walk 99 % --    Max Ex. HR 100 bpm 91 bpm    Max Ex. BP 112/78 138/62    2 Minute Post BP 124/72 98/62             Psychological, QOL, Others - Outcomes: PHQ 2/9: Depression screen Cory Hampton 2/9 05/12/2021 03/18/2021  Decreased Interest 0 0  Down, Depressed, Hopeless 0 0  PHQ - 2 Score 0 0    Quality of Life:  Quality of Life - 05/12/21 1215       Quality of Life   Select Quality of Life       Quality of Life Scores   Health/Function Pre 25 %    Health/Function Post 30 %    Health/Function % Change 20 %    Socioeconomic Pre 22.93 %    Socioeconomic Post 30 %    Socioeconomic % Change  30.83 %    Psych/Spiritual Pre 22.93 %    Psych/Spiritual Post 30 %    Psych/Spiritual % Change 30.83 %    Family Pre 28.8 %    Family Post 30 %    Family % Change 4.17 %    GLOBAL Pre 24.71 %    GLOBAL Post 30 %    GLOBAL % Change 21.41 %             Personal Goals: Goals established at orientation with interventions provided to work toward goal.  Personal Goals and Risk Factors at Admission - 03/18/21 1451       Core Components/Risk Factors/Patient Goals on Admission    Weight Management Yes;Weight Maintenance    Intervention Weight Management: Develop a combined nutrition and exercise program designed to reach desired caloric intake, while maintaining appropriate intake of nutrient and fiber, sodium and fats, and appropriate energy expenditure required for the weight goal.;Weight Management: Provide education and appropriate resources to help participant work on and attain dietary goals.    Expected Outcomes Weight Maintenance: Understanding of the daily nutrition guidelines, which includes 25-35% calories from fat, 7% or less cal from saturated fats, less than 260m cholesterol, less than 1.5gm of sodium, & 5 or more servings of fruits and vegetables daily;Understanding recommendations for meals to include 15-35% energy as protein, 25-35% energy from fat, 35-60% energy from carbohydrates, less than 20108mof dietary cholesterol, 20-35 gm of total fiber daily    Hypertension  Yes    Intervention Provide education on lifestyle modifcations including regular physical activity/exercise, weight management, moderate sodium restriction and increased consumption of fresh fruit, vegetables, and low fat dairy, alcohol moderation, and smoking cessation.;Monitor prescription use compliance.     Expected Outcomes Short Term: Continued assessment and intervention until BP is < 140/28m HG in hypertensive participants. < 130/852mHG in hypertensive participants with diabetes, heart failure or chronic kidney disease.;Long Term: Maintenance of blood pressure at goal levels.    Lipids Yes    Intervention Provide education and support for participant on nutrition & aerobic/resistive exercise along with prescribed medications to achieve LDL <7076mHDL >60m5m  Expected Outcomes Short Term: Participant states understanding of desired cholesterol values and is compliant with medications prescribed. Participant is following exercise prescription and nutrition guidelines.;Long Term: Cholesterol controlled with medications as prescribed, with individualized exercise RX and with personalized nutrition plan. Value goals: LDL < 70mg19mL > 40 mg.    Stress Yes    Intervention Offer individual and/or small group education and counseling on adjustment to heart disease, stress management and health-related lifestyle change. Teach and support self-help strategies.;Refer participants experiencing significant psychosocial distress to appropriate mental health specialists for further evaluation and treatment. When possible, include family members and significant others in education/counseling sessions.    Expected Outcomes Short Term: Participant demonstrates changes in health-related behavior, relaxation and other stress management skills, ability to obtain effective social support, and compliance with psychotropic medications if prescribed.;Long Term: Emotional wellbeing is indicated by absence of clinically significant psychosocial distress or social isolation.              Personal Goals Discharge:  Goals and Risk Factor Review     Row Name 04/07/21 1729 05/05/21 1710           Core Components/Risk Factors/Patient Goals Review   Personal Goals Review Weight  Management/Obesity;Stress;Lipids;Hypertension Weight Management/Obesity;Stress;Lipids;Hypertension      Review Cory Hampton well with exercise when in attendance vital signs have been stable. Attendance has been sporadic due to work and Cory Hampton to do well  exercise when in attendance vital signs have been stable. Attendance has been sporadic due to work and [facility] complete phase 2 cardiac rehab next week.      Expected Outcomes Cory Hampton continue to partcipate in phase 2 cardiac rehab for exercise, nutrition and lifestyle modifications. Cory Hampton continue to partcipate in phase 2 cardiac rehab for exercise, nutrition and lifestyle modifications.               Exercise Goals and Review:  Exercise Goals     Row Name 03/18/21 1324             Exercise Goals   Increase Physical Activity Yes       Intervention Provide advice, education, support and counseling about physical activity/exercise needs.;Develop an individualized exercise prescription for aerobic and resistive training based on initial evaluation findings, risk stratification, comorbidities and participant's personal goals.       Expected Outcomes Short Term: Attend rehab on a regular basis to increase amount of physical activity.;Long Term: Exercising regularly at least 3-5 days a week.;Long Term: Add in home exercise to make exercise part of routine and to increase amount of physical activity.       Increase Strength and Stamina Yes       Intervention Provide advice, education, support and counseling about physical activity/exercise needs.;Develop an individualized exercise prescription for aerobic  and resistive training based on initial evaluation findings, risk stratification, comorbidities and participant's personal goals.       Expected Outcomes Short Term: Increase workloads from initial exercise prescription for resistance, speed, and METs.;Short Term: Perform resistance  training exercises routinely during rehab and add in resistance training at home;Long Term: Improve cardiorespiratory fitness, muscular endurance and strength as measured by increased METs and functional capacity (6MWT)       Able to understand and use rate of perceived exertion (RPE) scale Yes       Intervention Provide education and explanation on how to use RPE scale       Expected Outcomes Short Term: Able to use RPE daily in rehab to express subjective intensity level;Long Term:  Able to use RPE to guide intensity level when exercising independently       Knowledge and understanding of Target Heart Rate Range (THRR) Yes       Intervention Provide education and explanation of THRR including how the numbers were predicted and where they are located for reference       Expected Outcomes Short Term: Able to state/look up THRR;Long Term: Able to use THRR to govern intensity when exercising independently;Short Term: Able to use daily as guideline for intensity in rehab       Able to check pulse independently Yes       Intervention Provide education and demonstration on how to check pulse in carotid and radial arteries.;Review the importance of being able to check your own pulse for safety during independent exercise       Expected Outcomes Short Term: Able to explain why pulse checking is important during independent exercise;Long Term: Able to check pulse independently and accurately       Understanding of Exercise Prescription Yes       Intervention Provide education, explanation, and written materials on patient's individual exercise prescription       Expected Outcomes Short Term: Able to explain program exercise prescription;Long Term: Able to explain home exercise prescription to exercise independently                Exercise Goals Re-Evaluation:  Exercise Goals Re-Evaluation     Row Name 03/24/21 1147 04/04/21 1150 05/05/21 1050 05/12/21 1215       Exercise Goal Re-Evaluation    Exercise Goals Review Increase Physical Activity;Able to understand and use rate of perceived exertion (RPE) scale Increase Physical Activity;Able to understand and use rate of perceived exertion (RPE) scale;Understanding of Exercise Prescription;Increase Strength and Stamina;Knowledge and understanding of Target Heart Rate Range (THRR);Able to check pulse independently Increase Physical Activity;Able to understand and use rate of perceived exertion (RPE) scale;Understanding of Exercise Prescription;Increase Strength and Stamina;Knowledge and understanding of Target Heart Rate Range (THRR);Able to check pulse independently Increase Physical Activity;Able to understand and use rate of perceived exertion (RPE) scale;Understanding of Exercise Prescription;Increase Strength and Stamina;Knowledge and understanding of Target Heart Rate Range (THRR);Able to check pulse independently    Comments Patient able to understand and use RPE scale appropriately. Reviewed exercise prescription with patient. Patient is currently walking outdoors or on his treadmill at home 40 minutes most days of the week as his mode of home exercise. Patient is also stretching and has a recumbent bike at home that he uses a few minutes at time as part of his exercise routine. Patient has a FitBit to monitor his steps and heart rate. Patient's goal is to achieve at least 10,000 steps daily. Patient's goal is to increase  workloads at cardiac rehab and to know his parameters, so that he can feel comfortable with home exercise routine. Patient is progressing well with exercise. He feels his strength and stamina are pretty much back to where they were prior to his cardiac event. Patient is exercising most days of the week. Patient will return to curling practice this Wednesday, which was one of his goals. Patient is progressing well with exercise. He feels his strength and stamina are pretty much back to where they were prior to his cardiac event.  Patient is exercising most days of the week. Patient will return to curling practice this Wednesday, which was one of his goals.    Expected Outcomes Increase workloads as tolerated to help improve cardiorespiratory fitness. Patient will continue to progress workloads as tolerated. Patient will continue current exercise routine to help continue to build strength and stamina. Patient will continue current exercise routine to help continue to build strength and stamina.             Nutrition & Weight - Outcomes:  Pre Biometrics - 03/18/21 1323       Pre Biometrics   Waist Circumference 35.5 inches    Hip Circumference 38 inches    Waist to Hip Ratio 0.93 %    Triceps Skinfold 10 mm    % Body Fat 22.5 %    Grip Strength 36.5 kg    Flexibility 9.25 in    Single Leg Stand 30 seconds             Post Biometrics - 05/12/21 1057        Post  Biometrics   Height 5' 9.25" (1.759 m)    Weight 77.4 kg    Waist Circumference 34.75 inches    Hip Circumference 27.25 inches    Waist to Hip Ratio 1.28 %    BMI (Calculated) 25.02    Triceps Skinfold 9 mm    % Body Fat 21.8 %    Grip Strength 39 kg    Flexibility 9.13 in    Single Leg Stand 20.56 seconds             Nutrition:  Nutrition Therapy & Goals - 03/28/21 1111       Nutrition Therapy   Diet TLC    Drug/Food Interactions Statins/Certain Fruits      Personal Nutrition Goals   Nutrition Goal Pt to build a healthy plate including vegetables, fruits, whole grains, and low-fat dairy products in a heart healthy meal plan.      Intervention Plan   Intervention Prescribe, educate and counsel regarding individualized specific dietary modifications aiming towards targeted core components such as weight, hypertension, lipid management, diabetes, heart failure and other comorbidities.;Nutrition handout(s) given to patient.    Expected Outcomes Long Term Goal: Adherence to prescribed nutrition plan.;Short Term Goal: Understand  basic principles of dietary content, such as calories, fat, sodium, cholesterol and nutrients.             Nutrition Discharge:   Education Questionnaire Score:  Knowledge Questionnaire Score - 05/12/21 1215       Knowledge Questionnaire Score   Pre Score 23/24    Post Score 24/24             Goals reviewed with patient; copy given to patient.Pt graduated from cardiac rehab program today with completion of 6 exercise sessions in Phase II. Cory Hampton was absent a lot due to personal reasons work obligations. Cory Hampton progressed nicely during his participation in  rehab as evidenced by increased MET level.   Medication list reconciled. Repeat  PHQ score-  0.  Pt has made significant lifestyle changes and should be commended for his success. Pt feels he has achieved his goals during cardiac rehab.   Pt plans to continue exercise by walking, using hand weights and curling. Cory Hampton increased his distance on his post exercise by 279 feet. We are proud of Cory Hampton's progress!Cory Pall, RN,BSN 05/21/2021 10:41 AM

## 2021-05-14 ENCOUNTER — Encounter (HOSPITAL_COMMUNITY): Payer: 59

## 2021-05-16 ENCOUNTER — Encounter (HOSPITAL_COMMUNITY): Payer: 59

## 2021-05-19 ENCOUNTER — Other Ambulatory Visit: Payer: Self-pay | Admitting: Cardiology

## 2021-05-19 DIAGNOSIS — I209 Angina pectoris, unspecified: Secondary | ICD-10-CM

## 2021-07-03 ENCOUNTER — Other Ambulatory Visit: Payer: Self-pay | Admitting: Cardiology

## 2021-07-03 DIAGNOSIS — Z955 Presence of coronary angioplasty implant and graft: Secondary | ICD-10-CM

## 2021-07-03 DIAGNOSIS — I2510 Atherosclerotic heart disease of native coronary artery without angina pectoris: Secondary | ICD-10-CM

## 2021-07-03 DIAGNOSIS — E7800 Pure hypercholesterolemia, unspecified: Secondary | ICD-10-CM

## 2021-07-10 NOTE — Telephone Encounter (Signed)
From patient.

## 2021-07-11 NOTE — Telephone Encounter (Signed)
Should be on both aspirin and Plavix for minimum 6 months.   Since it has been 6 months he can stop Plavix and continue aspirin 81 mg p.o. daily OR continue the same and we can discuss this further at the next visit.  ST

## 2021-09-11 ENCOUNTER — Other Ambulatory Visit: Payer: Self-pay | Admitting: Cardiology

## 2021-09-11 DIAGNOSIS — I209 Angina pectoris, unspecified: Secondary | ICD-10-CM

## 2021-10-13 ENCOUNTER — Encounter: Payer: Self-pay | Admitting: Cardiology

## 2021-10-13 NOTE — Telephone Encounter (Signed)
From patient.

## 2021-10-17 NOTE — Progress Notes (Signed)
External Labs: Collected: 10/15/2021. BUN 17, creatinine 0.92. Sodium 141, potassium 3.9, chloride 102,  AST 21, ALT 19, alkaline phosphatase 106 Total cholesterol 99, triglyceride 130, HDL 35, non-HDL 40. TSH 2.36. Hemoglobin 15.2 g/dL, hematocrit 44.3%

## 2021-10-21 ENCOUNTER — Ambulatory Visit: Payer: 59 | Admitting: Cardiology

## 2021-10-25 ENCOUNTER — Other Ambulatory Visit: Payer: Self-pay | Admitting: Cardiology

## 2021-10-25 DIAGNOSIS — E7800 Pure hypercholesterolemia, unspecified: Secondary | ICD-10-CM

## 2021-10-25 DIAGNOSIS — Z955 Presence of coronary angioplasty implant and graft: Secondary | ICD-10-CM

## 2021-10-25 DIAGNOSIS — I2510 Atherosclerotic heart disease of native coronary artery without angina pectoris: Secondary | ICD-10-CM

## 2021-11-06 ENCOUNTER — Ambulatory Visit: Payer: 59 | Admitting: Cardiology

## 2021-11-07 ENCOUNTER — Ambulatory Visit: Payer: 59 | Admitting: Cardiology

## 2021-11-12 ENCOUNTER — Other Ambulatory Visit: Payer: Self-pay

## 2021-11-12 ENCOUNTER — Encounter: Payer: Self-pay | Admitting: Cardiology

## 2021-11-12 ENCOUNTER — Ambulatory Visit: Payer: 59 | Admitting: Cardiology

## 2021-11-12 VITALS — BP 131/77 | HR 75 | Temp 97.2°F | Resp 16 | Ht 69.0 in | Wt 175.0 lb

## 2021-11-12 DIAGNOSIS — E7800 Pure hypercholesterolemia, unspecified: Secondary | ICD-10-CM

## 2021-11-12 DIAGNOSIS — I10 Essential (primary) hypertension: Secondary | ICD-10-CM

## 2021-11-12 DIAGNOSIS — Z955 Presence of coronary angioplasty implant and graft: Secondary | ICD-10-CM

## 2021-11-12 DIAGNOSIS — I2510 Atherosclerotic heart disease of native coronary artery without angina pectoris: Secondary | ICD-10-CM

## 2021-11-12 MED ORDER — PRALUENT 75 MG/ML ~~LOC~~ SOAJ: 1.0000 mL | SUBCUTANEOUS | 0 refills | Status: DC

## 2021-11-12 NOTE — Progress Notes (Signed)
? ?Date:  11/12/2021  ? ?ID:  Cory Hampton, DOB [birth year], MRN [id number] ? ?PCP:  Mila Palmer, MD  ?Cardiologist:  Tessa Lerner, DO, [facility] (established care 12/23/2020) ?Former Cardiology Providers: Dr. Jacinto Halim  ? ?Date: 11/12/21 ?Last Office Visit: 04/21/2021 ? ? ?Chief Complaint  ?Patient presents with  ? Coronary Artery Disease  ? Follow-up  ? ? ?HPI  ?Cory Hampton is a 66 y.o. male whose past medical history and cardiovascular risk factors include: Established coronary disease status post intervention, hypertension, hyperlipidemia, ED, restless leg syndrome.  ? ?Patient was initially referred to the practice back in May 2022 for evaluation of chest pain.  His symptoms are very concerning for unstable angina and he was scheduled for left heart catheterization.  He was noted to have a CTO of the RCA and disease involving the LAD distribution.  The disease within the LAD was not hemodynamically significant by invasive hemodynamics and therefore patient underwent angioplasty and successful stenting of the RCA with residual 0% stenosis and TIMI-3 flow.  Since then his medical therapy has also been uptitrated. ? ?He has been intolerant to statin therapy in the past as well as Zetia.  He is currently on Praluent and his LDL levels have improved significantly.  Most recent labs independently reviewed and noted below for further reference. ? ?Clinically he denies angina pectoris or heart failure symptoms.  He continues to walk approximately 3.5 miles regularly without any change in physical endurance/symptoms.  No use of sublingual nitroglycerin tablets since last office encounter. ? ?FUNCTIONAL STATUS: ?Walk 3.5 miles a day or 10,000 steps a day.   ? ?ALLERGIES: ?Allergies  ?Allergen Reactions  ? Lisinopril Hives  ? Statins Other (See Comments)  ?  Weakness, joint aches  ? ? ?MEDICATION LIST PRIOR TO VISIT: ?Current Meds  ?Medication Sig  ? acetaminophen (TYLENOL) 650 MG CR tablet Take 650-1,300 mg by mouth every  8 (eight) hours as needed for pain.  ? aspirin EC 81 MG tablet Take 81 mg by mouth at bedtime. Swallow whole.  ? cetirizine (ZYRTEC) 10 MG tablet Take 10 mg by mouth daily as needed for allergies.  ? Cholecalciferol (VITAMIN D) 2000 units CAPS Take 2,000 Units by mouth daily.  ? Coenzyme Q10 (COQ10) 200 MG CAPS Take 200 mg by mouth every evening.  ? diclofenac Sodium (VOLTAREN) 1 % GEL Apply 1 application topically 4 (four) times daily as needed (pain).  ? Flaxseed, Linseed, (FLAXSEED OIL) 1200 MG CAPS Take 1,200 mg by mouth in the morning and at bedtime. W/Omega 3 540mg   ? Glucos-Chond-Hyal Ac-Ca Fructo (MOVE FREE JOINT HEALTH ADVANCE PO) Take 1 tablet by mouth daily.  ? Homeopathic Products [facility] ALLERGY EYE RELIEF OP) Place 1-2 drops into both eyes 3 (three) times daily as needed (dry/allergy eyes).  ? hydrochlorothiazide (HYDRODIURIL) 25 MG tablet Take 25 mg by mouth in the morning.  ? ketotifen (ZADITOR) 0.025 % ophthalmic solution Place 1-2 drops into both eyes at bedtime as needed (allergies.).  ? losartan (COZAAR) 25 MG tablet Take 50 mg by mouth at bedtime.  ? MAGNESIUM CITRATE PO Take 250 mg by mouth every evening.  ? metoprolol succinate (TOPROL-XL) 25 MG 24 hr tablet TAKE 1 TABLET (25 MG TOTAL) BY MOUTH DAILY.  ? Multiple Vitamin (MULTIVITAMIN WITH MINERALS) TABS tablet Take 1 tablet by mouth daily.  ? nitroGLYCERIN (NITROSTAT) 0.4 MG SL tablet Place 1 tablet (0.4 mg total) under the tongue every 5 (five) minutes as needed for chest pain. If  you require more than two tablets five minutes apart go to the nearest ER via EMS.  ? omalizumab (XOLAIR) 150 MG injection Inject into the skin every 28 (twenty-eight) days.  ? simvastatin (ZOCOR) 20 MG tablet TAKE 1 TABLET BY MOUTH EVERY DAY  ? [DISCONTINUED] PRALUENT 75 MG/ML SOAJ INJECT 1 ML AS DIRECTED EVERY 14 (FOURTEEN) DAYS.  ?  ? ?PAST MEDICAL HISTORY: ?Past Medical History:  ?Diagnosis Date  ? Allergy   ? Coronary artery disease   ? Erectile dysfunction    ? Hyperlipidemia   ? Hypertension   ? Restless leg syndrome   ? ? ?PAST SURGICAL HISTORY: ?Past Surgical History:  ?Procedure Laterality Date  ? CARDIAC CATHETERIZATION    ? COLONOSCOPY    ? CORONARY CTO INTERVENTION N/A 12/31/2020  ? Procedure: CORONARY CTO INTERVENTION;  Surgeon: Yates Decamp, MD;  Location: [facility];  Service: Cardiovascular;  Laterality: N/A;  ? CORONARY STENT INTERVENTION N/A 12/31/2020  ? Procedure: CORONARY STENT INTERVENTION;  Surgeon: Yates Decamp, MD;  Location: [facility];  Service: Cardiovascular;  Laterality: N/A;  ? FOREIGN BODY REMOVAL ABDOMINAL    ? age 72  ? INTRAVASCULAR PRESSURE WIRE/FFR STUDY N/A 12/31/2020  ? Procedure: INTRAVASCULAR PRESSURE WIRE/FFR STUDY;  Surgeon: Yates Decamp, MD;  Location: [facility];  Service: Cardiovascular;  Laterality: N/A;  ? LEFT HEART CATH AND CORONARY ANGIOGRAPHY N/A 12/31/2020  ? Procedure: LEFT HEART CATH AND CORONARY ANGIOGRAPHY;  Surgeon: Yates Decamp, MD;  Location: [facility];  Service: Cardiovascular;  Laterality: N/A;  ? WISDOM TOOTH EXTRACTION    ? ? ?FAMILY HISTORY: ?The patient family history includes Dementia in his father; Hypertension in his sister; Stroke in his father and mother. ? ?SOCIAL HISTORY:  ?The patient  reports that he has never smoked. He has never used smokeless tobacco. He reports current alcohol use of about 4.0 standard drinks per week. He reports that he does not use drugs. ? ?REVIEW OF SYSTEMS: ?Review of Systems  ?Cardiovascular:  Negative for chest pain, claudication, dyspnea on exertion, leg swelling, near-syncope, orthopnea, palpitations, paroxysmal nocturnal dyspnea and syncope.  ?Respiratory:  Negative for shortness of breath.   ? ?PHYSICAL EXAM: ? ?  11/12/2021  ? 10:45 AM 05/12/2021  ? 10:57 AM 04/21/2021  ?  1:42 PM  ?Vitals with BMI  ?Height 5\' 9"  5' 9.25" 5\' 9"   ?Weight 175 lbs 170 lbs 10 oz 171 lbs  ?BMI 25.83 25.02 25.24  ?Systolic 131 110 01-22-1977  ?Diastolic 77 62 76  ?Pulse 75 81  73  ? ? ?CONSTITUTIONAL: Well-developed and well-nourished. No acute distress.  ?SKIN: Skin is warm and dry. No rash noted. No cyanosis. No pallor. No jaundice ?HEAD: Normocephalic and atraumatic.  ?EYES: No scleral icterus ?MOUTH/THROAT: Moist oral membranes.  ?NECK: No JVD present. No thyromegaly noted. No carotid bruits  ?LYMPHATIC: No visible cervical adenopathy.  ?CHEST Normal respiratory effort. No intercostal retractions  ?LUNGS: Clear to auscultation bilaterally.  No stridor. No wheezes. No rales.  ?CARDIOVASCULAR: Regular rate and rhythm, positive S1-S2, no murmurs rubs or gallops appreciated. ?ABDOMINAL: Soft, nontender, nondistended, positive bowel sounds in all 4 quadrants, no apparent ascites.  ?EXTREMITIES: No peripheral edema  ?HEMATOLOGIC: No significant bruising ?NEUROLOGIC: Oriented to person, place, and time. Nonfocal. Normal muscle tone.  ?PSYCHIATRIC: Normal mood and affect. Normal behavior. Cooperative ? ?CARDIAC DATABASE: ?EKG: ?12/23/2020: Normal sinus rhythm, 85 bpm, normal axis, left atrial enlargement, without underlying ischemia or injury pattern.  ?01/15/2021: Normal  sinus rhythm, 68 bpm, normal axis, without underlying ischemia or injury pattern. ?11/12/2021: NSR, 76 bpm, normal axis, without underlying ischemia or injury pattern. ? ?Echocardiogram: ?11/06/2020: LVEF 55 to 60%, no regional wall motion abnormalities, normal diastolic function, trivial MR. ? ?Stress Testing: ?11/12/2020 MPI: ?Nuclear stress EF: 63%. ?The left ventricular ejection fraction is normal (55-65%). ?There was no ST segment deviation noted during stress. ?No T wave inversion was noted during stress. ?Defect 1: There is a small defect of moderate severity present in the apex location. ?Findings consistent with ischemia. However, there is normal wall motion. Cannot rule out apical thinning. ?This is a low risk study ? ?Heart Catheterization: ?12/31/2020: ?LV: Low LVEDP at 6 mmHg.  There was no pressure gradient across  the aortic valve. ?LM: It is mildly diseased, mild calcification evident, mid segment has a 20% stenosis. ?LAD: Diffusely diseased, proximal to mid segment has mild to moderate amount of calcificati

## 2021-11-23 ENCOUNTER — Other Ambulatory Visit: Payer: Self-pay | Admitting: Cardiology

## 2021-11-23 DIAGNOSIS — Z955 Presence of coronary angioplasty implant and graft: Secondary | ICD-10-CM

## 2021-11-23 DIAGNOSIS — E7800 Pure hypercholesterolemia, unspecified: Secondary | ICD-10-CM

## 2021-11-23 DIAGNOSIS — I2510 Atherosclerotic heart disease of native coronary artery without angina pectoris: Secondary | ICD-10-CM

## 2021-12-20 ENCOUNTER — Other Ambulatory Visit: Payer: Self-pay | Admitting: Cardiology

## 2021-12-20 DIAGNOSIS — I209 Angina pectoris, unspecified: Secondary | ICD-10-CM

## 2021-12-22 ENCOUNTER — Other Ambulatory Visit: Payer: Self-pay

## 2021-12-22 ENCOUNTER — Encounter: Payer: Self-pay | Admitting: Cardiology

## 2021-12-22 DIAGNOSIS — I209 Angina pectoris, unspecified: Secondary | ICD-10-CM

## 2021-12-22 MED ORDER — METOPROLOL SUCCINATE ER 25 MG PO TB24: 25.0000 mg | ORAL_TABLET | Freq: Every day | ORAL | 0 refills | Status: DC

## 2021-12-29 ENCOUNTER — Encounter: Payer: Self-pay | Admitting: Cardiology

## 2021-12-29 NOTE — Telephone Encounter (Signed)
Pt needs repatha instead of praluent ?

## 2021-12-31 ENCOUNTER — Other Ambulatory Visit: Payer: Self-pay | Admitting: Cardiology

## 2021-12-31 DIAGNOSIS — E7800 Pure hypercholesterolemia, unspecified: Secondary | ICD-10-CM

## 2021-12-31 MED ORDER — REPATHA SURECLICK 140 MG/ML ~~LOC~~ SOAJ: 140.0000 mg | SUBCUTANEOUS | 3 refills | Status: DC

## 2022-01-05 ENCOUNTER — Other Ambulatory Visit: Payer: Self-pay | Admitting: Cardiology

## 2022-01-05 DIAGNOSIS — E7800 Pure hypercholesterolemia, unspecified: Secondary | ICD-10-CM

## 2022-01-23 ENCOUNTER — Other Ambulatory Visit: Payer: Self-pay | Admitting: Cardiology

## 2022-01-23 DIAGNOSIS — I209 Angina pectoris, unspecified: Secondary | ICD-10-CM

## 2022-02-08 ENCOUNTER — Other Ambulatory Visit: Payer: Self-pay | Admitting: Cardiology

## 2022-02-08 DIAGNOSIS — Z955 Presence of coronary angioplasty implant and graft: Secondary | ICD-10-CM

## 2022-02-08 DIAGNOSIS — I2510 Atherosclerotic heart disease of native coronary artery without angina pectoris: Secondary | ICD-10-CM

## 2022-02-08 DIAGNOSIS — E7800 Pure hypercholesterolemia, unspecified: Secondary | ICD-10-CM

## 2022-02-26 ENCOUNTER — Other Ambulatory Visit: Payer: Self-pay | Admitting: Cardiology

## 2022-02-26 DIAGNOSIS — E7800 Pure hypercholesterolemia, unspecified: Secondary | ICD-10-CM

## 2022-02-26 DIAGNOSIS — I2510 Atherosclerotic heart disease of native coronary artery without angina pectoris: Secondary | ICD-10-CM

## 2022-02-26 DIAGNOSIS — Z955 Presence of coronary angioplasty implant and graft: Secondary | ICD-10-CM

## 2022-03-02 ENCOUNTER — Encounter: Payer: Self-pay | Admitting: Cardiology

## 2022-03-06 ENCOUNTER — Other Ambulatory Visit: Payer: Self-pay

## 2022-03-06 DIAGNOSIS — E7800 Pure hypercholesterolemia, unspecified: Secondary | ICD-10-CM

## 2022-03-06 MED ORDER — REPATHA SURECLICK 140 MG/ML ~~LOC~~ SOAJ: 140.0000 mg | SUBCUTANEOUS | 3 refills | Status: AC

## 2022-05-11 ENCOUNTER — Encounter: Payer: Self-pay | Admitting: Cardiology

## 2022-05-11 NOTE — Telephone Encounter (Signed)
From pt

## 2022-05-15 ENCOUNTER — Encounter: Payer: Self-pay | Admitting: Cardiology

## 2022-05-15 ENCOUNTER — Ambulatory Visit: Payer: 59 | Admitting: Cardiology

## 2022-05-15 VITALS — BP 143/81 | HR 80 | Temp 98.1°F | Resp 16 | Ht 69.0 in | Wt 168.0 lb

## 2022-05-15 DIAGNOSIS — I10 Essential (primary) hypertension: Secondary | ICD-10-CM

## 2022-05-15 DIAGNOSIS — I2510 Atherosclerotic heart disease of native coronary artery without angina pectoris: Secondary | ICD-10-CM

## 2022-05-15 DIAGNOSIS — Z955 Presence of coronary angioplasty implant and graft: Secondary | ICD-10-CM

## 2022-05-15 DIAGNOSIS — E7800 Pure hypercholesterolemia, unspecified: Secondary | ICD-10-CM

## 2022-05-15 NOTE — Progress Notes (Signed)
Date:  05/15/2022   ID:  Cory Hampton, DOB 08/12/1956, MRN [id number]  PCP:  Jonathon Jordan, MD  Cardiologist:  Rex Kras, DO, [facility] (established care 12/23/2020) Former Cardiology Providers: Dr. Einar Gip   Date: 05/15/22 Last Office Visit: 11/12/2021   Chief Complaint  Patient presents with   Coronary Artery Disease   Follow-up    HPI  Cory Hampton is a 66 y.o. male whose past medical history and cardiovascular risk factors include: Established coronary disease status post intervention, hypertension, hyperlipidemia, ED, restless leg syndrome.   Referred to the practice in May 2022 for evaluation of chest pain.  His symptoms are very concerning for angina pectoris and was scheduled for a left heart catheterization directly.  He was noted to have a CTO of the RCA and disease involving the LAD distribution as well.  The disease within the LAD was not hemodynamically significant by invasive hemodynamics and therefore he underwent angioplasty and stenting of the RCA.  Since then he is done very well from medical standpoint and remains asymptomatic.  In the past he has been intolerant to statin therapy as well as Zetia.  He was on Praluent and his LDL levels have improved significantly.  However due to insurance preferences he was recently transitioned to [place] and he has been on it for the last month.  He has tolerated Repatha well without any side effects or intolerances.  His functional capacity remains relatively stable.  Because it is summer he is not walking on his treadmill but instead walks 4 miles daily outside and no exertional chest pain or shortness of breath.   FUNCTIONAL STATUS: Walk 3.5 miles a day or 10,000 steps a day.    ALLERGIES: Allergies  Allergen Reactions   Lisinopril Hives   Statins Other (See Comments)    Weakness, joint aches    MEDICATION LIST PRIOR TO VISIT: Current Meds  Medication Sig   acetaminophen (TYLENOL) 650 MG CR tablet Take  650-1,300 mg by mouth every 8 (eight) hours as needed for pain.   aspirin EC 81 MG tablet Take 81 mg by mouth at bedtime. Swallow whole.   cetirizine (ZYRTEC) 10 MG tablet Take 10 mg by mouth daily as needed for allergies.   Cholecalciferol (VITAMIN D) 2000 units CAPS Take 2,000 Units by mouth daily.   diclofenac Sodium (VOLTAREN) 1 % GEL Apply 1 application topically 4 (four) times daily as needed (pain).   Evolocumab (REPATHA SURECLICK) 384 MG/ML SOAJ Inject 140 mg into the skin every 14 (fourteen) days for 6 doses.   Flaxseed, Linseed, (FLAXSEED OIL) 1200 MG CAPS Take 1,200 mg by mouth in the morning and at bedtime. W/Omega 3 $RemoveB'540mg'HAkxSJFS$    Glucos-Chond-Hyal Ac-Ca Fructo (MOVE FREE JOINT HEALTH ADVANCE PO) Take 1 tablet by mouth daily.   Homeopathic Products [facility] ALLERGY EYE RELIEF OP) Place 1-2 drops into both eyes 3 (three) times daily as needed (dry/allergy eyes).   hydrochlorothiazide (HYDRODIURIL) 25 MG tablet Take 25 mg by mouth in the morning.   ketotifen (ZADITOR) 0.025 % ophthalmic solution Place 1-2 drops into both eyes at bedtime as needed (allergies.).   losartan (COZAAR) 25 MG tablet Take 50 mg by mouth at bedtime.   MAGNESIUM CITRATE PO Take 250 mg by mouth every evening.   metoprolol succinate (TOPROL-XL) 25 MG 24 hr tablet TAKE 1 TABLET (25 MG TOTAL) BY MOUTH DAILY.   Multiple Vitamin (MULTIVITAMIN WITH MINERALS) TABS tablet Take 1 tablet by mouth daily.   nitroGLYCERIN (NITROSTAT) 0.4  MG SL tablet Place 1 tablet (0.4 mg total) under the tongue every 5 (five) minutes as needed for chest pain. If you require more than two tablets five minutes apart go to the nearest ER via EMS.   omalizumab (XOLAIR) 150 MG injection Inject into the skin every 28 (twenty-eight) days.   simvastatin (ZOCOR) 20 MG tablet TAKE 1 TABLET BY MOUTH EVERY DAY     PAST MEDICAL HISTORY: Past Medical History:  Diagnosis Date   Allergy    Coronary artery disease    Erectile dysfunction    Hyperlipidemia     Hypertension    Restless leg syndrome     PAST SURGICAL HISTORY: Past Surgical History:  Procedure Laterality Date   CARDIAC CATHETERIZATION     COLONOSCOPY     CORONARY CTO INTERVENTION N/A 12/31/2020   Procedure: CORONARY CTO INTERVENTION;  Surgeon: Adrian Prows, MD;  Location: [place] [facility];  Service: Cardiovascular;  Laterality: N/A;   CORONARY STENT INTERVENTION N/A 12/31/2020   Procedure: CORONARY STENT INTERVENTION;  Surgeon: Adrian Prows, MD;  Location: [place] CV [facility];  Service: Cardiovascular;  Laterality: N/A;   FOREIGN BODY REMOVAL ABDOMINAL     age 47   INTRAVASCULAR PRESSURE WIRE/FFR STUDY N/A 12/31/2020   Procedure: INTRAVASCULAR PRESSURE WIRE/FFR STUDY;  Surgeon: Adrian Prows, MD;  Location: [place] CV LAB;  Service: Cardiovascular;  Laterality: N/A;   LEFT HEART CATH AND CORONARY ANGIOGRAPHY N/A 12/31/2020   Procedure: LEFT HEART CATH AND CORONARY ANGIOGRAPHY;  Surgeon: Adrian Prows, MD;  Location: [place] CV [facility];  Service: Cardiovascular;  Laterality: N/A;   WISDOM TOOTH EXTRACTION      FAMILY HISTORY: The patient family history includes Dementia in his father; Hypertension in his sister; Stroke in his father and mother.  SOCIAL HISTORY:  The patient  reports that he has never smoked. He has never used smokeless tobacco. He reports current alcohol use of about 4.0 standard drinks of alcohol per week. He reports that he does not use drugs.  REVIEW OF SYSTEMS: Review of Systems  Cardiovascular:  Negative for chest pain, claudication, dyspnea on exertion, leg swelling, near-syncope, orthopnea, palpitations, paroxysmal nocturnal dyspnea and syncope.  Respiratory:  Negative for shortness of breath.     PHYSICAL EXAM:    05/15/2022   10:26 AM 11/12/2021   10:45 AM 05/12/2021   10:57 AM  Vitals with BMI  Height $Remov'5\' 9"'TGbvTw$  $Remove'5\' 9"'eVrQbQu$  5' 9.25"  Weight 168 lbs 175 lbs 170 lbs 10 oz  BMI 24.8 57.26 20.35  Systolic 597 416 384  Diastolic 81 77 62  Pulse 80 75 81    Physical Exam  Constitutional: No distress.  Age appropriate, hemodynamically stable.   Neck: No JVD present.  Cardiovascular: Normal rate, regular rhythm, S1 normal, S2 normal, intact distal pulses and normal pulses. Exam reveals no gallop, no S3 and no S4.  No murmur heard. Pulmonary/Chest: Effort normal and breath sounds normal. No stridor. He has no wheezes. He has no rales.  Abdominal: Soft. Bowel sounds are normal. He exhibits no distension. There is no abdominal tenderness.  Musculoskeletal:        General: No edema.     Cervical back: Neck supple.  Neurological: He is alert and oriented to person, place, and time. He has intact cranial nerves (2-12).  Skin: Skin is warm and moist.   CARDIAC DATABASE: EKG: 05/15/2022: Normal sinus rhythm, 73 bpm, without underlying ischemia or injury pattern.  Echocardiogram: 11/06/2020: LVEF 55 to 60%, no  regional wall motion abnormalities, normal diastolic function, trivial MR.  Stress Testing: 11/12/2020 MPI: Nuclear stress EF: 63%. The left ventricular ejection fraction is normal (55-65%). There was no ST segment deviation noted during stress. No T wave inversion was noted during stress. Defect 1: There is a small defect of moderate severity present in the apex location. Findings consistent with ischemia. However, there is normal wall motion. Cannot rule out apical thinning. This is a low risk study  Heart Catheterization: 12/31/2020: LV: Low LVEDP at 6 mmHg.  There was no pressure gradient across the aortic valve. LM: It is mildly diseased, mild calcification evident, mid segment has a 20% stenosis. LAD: Diffusely diseased, proximal to mid segment has mild to moderate amount of calcification.  There are multiple tandem 30 to 60% stenosis.  D1 and D2 are small to moderate-sized. LAD gives contralateral collaterals to the RCA. RFR to proximal and mid LAD = 0.84, at most equivocal. CX: Very minimal disease.  Large OM1. RCA: CTO  proximal segment.  Mild to moderate amount of calcification evident in the RCA in the midsegment.  Intervention data: Successful CTO PCI with implantation of 2.5 x 28 mm Synergy DES which was postdilated with 2.75 x 15 mm L[place] at 18 atmospheric pressure giving 2.83 mm vessel.  Stenosis reduced from 100% to 0% with improvement in TIMI 0 to TIMI-3 flow. 150 mL contrast utilized.   Recommendation: Patient be discharged home in the morning, will need aggressive risk factor modification, he has statin intolerance, but is willing to try low-dose statin along with ezetimibe for now.  We will reevaluate this.  The LAD in the proximal to mid segment has diffuse tandem 50% stenosis took more 60% stenosis, RFR was negative for significant ischemia, needs aggressive risk factor modification and medical therapy.   LABORATORY DATA:    Latest Ref Rng & Units 01/01/2021    1:09 AM  CBC  WBC 4.0 - 10.5 K/uL 8.2   Hemoglobin 13.0 - 17.0 g/dL 14.4   Hematocrit 39.0 - 52.0 % 42.8   Platelets 150 - 400 K/uL 247        Latest Ref Rng & Units 01/01/2021    1:09 AM  CMP  Glucose 70 - 99 mg/dL 104   BUN 8 - 23 mg/dL 20   Creatinine 0.61 - 1.24 mg/dL 0.95   Sodium 135 - 145 mmol/L 138   Potassium 3.5 - 5.1 mmol/L 3.3   Chloride 98 - 111 mmol/L 102   CO2 22 - 32 mmol/L 28   Calcium 8.9 - 10.3 mg/dL 9.2     Lipid Panel  No results found for: "CHOL", "TRIG", "HDL", "CHOLHDL", "VLDL", "LDLCALC", "LDLDIRECT", "LABVLDL"  No components found for: "NTPROBNP" No results for input(s): "PROBNP" in the last 8760 hours. No results for input(s): "TSH" in the last 8760 hours.  BMP No results for input(s): "NA", "K", "CL", "CO2", "GLUCOSE", "BUN", "CREATININE", "CALCIUM", "GFRNONAA", "GFRAA" in the last 8760 hours.   HEMOGLOBIN A1C No results found for: "HGBA1C", "MPG"   External Labs: Collected: 12/26/2020 Lipid profile: Total cholesterol 204, triglycerides 114, HDL 41,  LDL 142.   External  Labs: Collected: 03/18/2021. Serum creatinine 0.97 mg/dL. eGFR 87 mL/min per 1.73 m Potassium 4.2. AST 21, ALT 25, alkaline phosphatase 83 (all within normal limits). Total cholesterol 78, triglycerides 78, HDL 40, non-HDL 22  TSH 2.39. Hemoglobin 15.5 g/dL, hematocrit 45.9%  External Labs: Collected: 10/15/2021. BUN 17, creatinine 0.92. Sodium 141, potassium 3.9, chloride 102,  AST 21, ALT 19, alkaline phosphatase 106 Total cholesterol 99, triglyceride 130, HDL 35, non-HDL 40. TSH 2.36. Hemoglobin 15.2 g/dL, hematocrit 44.3%  External Labs: Collected: 04/17/2022 (labs before changing from Praluent to [place]). Total cholesterol 108, triglycerides 105, HDL 46, calculated LDL 41.  IMPRESSION:    ICD-10-CM   1. Atherosclerosis of native coronary artery of native heart without angina pectoris  I25.10 EKG 12-Lead    2. History of coronary angioplasty with insertion of stent  Z95.5     3. Pure hypercholesterolemia  E78.00     4. Hypertension, essential  I10        RECOMMENDATIONS: Cory Hampton is a 66 y.o. male whose past medical history and cardiac risk factors include: Established coronary disease status post intervention, hypertension, hyperlipidemia, ED, restless leg syndrome.   Atherosclerosis of native coronary artery of native heart without angina pectoris, s/p RCA PCI  Denies angina pectoris. No use of sublingual nitroglycerin tablets since last office visit. Functional status remains relatively stable. No ER or urgent care visits for cardiovascular symptoms. EKG: Nonischemic. Last echo 2022 results reviewed no indication for repeating at currently Medications reconciled. Outside labs from 04/17/2022 independently reviewed and noted above for further reference.  Pure hypercholesterolemia In the past his LDL levels were as high as 142 mg/dL and with the underlying CAD that shared decision was to uptitrate statin therapy. He has been intolerant to multiple statins  including Zetia in the past and was placed on Praluent. Since last office visit he was transitioned to [place] as it is more affordable (insurance formulary). He has had 2 doses of Repatha and he is doing well.  Hypertension, essential Continue current medical therapy. Patient states that home blood pressures are better controlled. Reemphasized the importance of a low-salt diet.  Patient presents today for 59-month follow-up visit given his underlying CAD.  His overall functional capacity remains relatively stable, and EKG is nonischemic.  Recommend 1 year follow-up visit sooner if needed given his diagnosis of CAD with prior intervention.  Patient is agreeable with the plan of care.  FINAL MEDICATION LIST END OF ENCOUNTER: No orders of the defined types were placed in this encounter.   There are no discontinued medications.    Current Outpatient Medications:    acetaminophen (TYLENOL) 650 MG CR tablet, Take 650-1,300 mg by mouth every 8 (eight) hours as needed for pain., Disp: , Rfl:    aspirin EC 81 MG tablet, Take 81 mg by mouth at bedtime. Swallow whole., Disp: , Rfl:    cetirizine (ZYRTEC) 10 MG tablet, Take 10 mg by mouth daily as needed for allergies., Disp: , Rfl:    Cholecalciferol (VITAMIN D) 2000 units CAPS, Take 2,000 Units by mouth daily., Disp: , Rfl:    diclofenac Sodium (VOLTAREN) 1 % GEL, Apply 1 application topically 4 (four) times daily as needed (pain)., Disp: , Rfl:    Evolocumab (REPATHA SURECLICK) 782 MG/ML SOAJ, Inject 140 mg into the skin every 14 (fourteen) days for 6 doses., Disp: 2 mL, Rfl: 3   Flaxseed, Linseed, (FLAXSEED OIL) 1200 MG CAPS, Take 1,200 mg by mouth in the morning and at bedtime. W/Omega 3 $RemoveB'540mg'NkFlCswA$ , Disp: , Rfl:    Glucos-Chond-Hyal Ac-Ca Fructo (MOVE FREE JOINT HEALTH ADVANCE PO), Take 1 tablet by mouth daily., Disp: , Rfl:    Homeopathic Products [facility] ALLERGY EYE RELIEF OP), Place 1-2 drops into both eyes 3 (three) times daily as needed  (dry/allergy eyes)., Disp: , Rfl:    hydrochlorothiazide (  HYDRODIURIL) 25 MG tablet, Take 25 mg by mouth in the morning., Disp: , Rfl:    ketotifen (ZADITOR) 0.025 % ophthalmic solution, Place 1-2 drops into both eyes at bedtime as needed (allergies.)., Disp: , Rfl:    losartan (COZAAR) 25 MG tablet, Take 50 mg by mouth at bedtime., Disp: , Rfl:    MAGNESIUM CITRATE PO, Take 250 mg by mouth every evening., Disp: , Rfl:    metoprolol succinate (TOPROL-XL) 25 MG 24 hr tablet, TAKE 1 TABLET (25 MG TOTAL) BY MOUTH DAILY., Disp: 90 tablet, Rfl: 0   Multiple Vitamin (MULTIVITAMIN WITH MINERALS) TABS tablet, Take 1 tablet by mouth daily., Disp: , Rfl:    nitroGLYCERIN (NITROSTAT) 0.4 MG SL tablet, Place 1 tablet (0.4 mg total) under the tongue every 5 (five) minutes as needed for chest pain. If you require more than two tablets five minutes apart go to the nearest ER via EMS., Disp: 30 tablet, Rfl: 0   omalizumab (XOLAIR) 150 MG injection, Inject into the skin every 28 (twenty-eight) days., Disp: , Rfl:    simvastatin (ZOCOR) 20 MG tablet, TAKE 1 TABLET BY MOUTH EVERY DAY, Disp: 90 tablet, Rfl: 0   Coenzyme Q10 (COQ10) 200 MG CAPS, Take 200 mg by mouth every evening. (Patient not taking: Reported on 05/15/2022), Disp: , Rfl:   Orders Placed This Encounter  Procedures   EKG 12-Lead   There are no Patient Instructions on file for this visit.   --Continue cardiac medications as reconciled in final medication list. --Return in about 1 year (around 05/16/2023) for Follow up, CAD. Or sooner if needed. --Continue follow-up with your primary care physician regarding the management of your other chronic comorbid conditions.  Patient's questions and concerns were addressed to his satisfaction. He voices understanding of the instructions provided during this encounter.   This note was created using a voice recognition software as a result there may be grammatical errors inadvertently enclosed that do not reflect  the nature of this encounter. Every attempt is made to correct such errors.  Rex Kras, [place], [facility]  Pager: [phone] Office: [phone]

## 2022-05-18 ENCOUNTER — Encounter: Payer: Self-pay | Admitting: Cardiology

## 2022-05-20 NOTE — Telephone Encounter (Signed)
From Patient

## 2022-06-09 ENCOUNTER — Encounter: Payer: Self-pay | Admitting: Cardiology

## 2022-06-09 NOTE — Telephone Encounter (Signed)
From patient.

## 2022-06-12 ENCOUNTER — Encounter (INDEPENDENT_AMBULATORY_CARE_PROVIDER_SITE_OTHER): Payer: Self-pay

## 2022-06-12 NOTE — Telephone Encounter (Signed)
From patient.

## 2022-06-18 NOTE — Telephone Encounter (Signed)
Has this been sent Dr. Terri Skains?

## 2022-06-19 ENCOUNTER — Other Ambulatory Visit: Payer: Self-pay | Admitting: Cardiology

## 2022-06-19 ENCOUNTER — Other Ambulatory Visit: Payer: Self-pay

## 2022-06-19 DIAGNOSIS — Z955 Presence of coronary angioplasty implant and graft: Secondary | ICD-10-CM

## 2022-06-19 DIAGNOSIS — E7800 Pure hypercholesterolemia, unspecified: Secondary | ICD-10-CM

## 2022-06-19 DIAGNOSIS — I2510 Atherosclerotic heart disease of native coronary artery without angina pectoris: Secondary | ICD-10-CM

## 2022-06-19 MED ORDER — PRALUENT 75 MG/ML ~~LOC~~ SOAJ: 1.0000 mL | SUBCUTANEOUS | 3 refills | Status: DC

## 2022-06-19 MED ORDER — PRALUENT 75 MG/ML ~~LOC~~ SOAJ: 1.0000 mL | SUBCUTANEOUS | 3 refills | Status: AC

## 2022-06-19 NOTE — Telephone Encounter (Signed)
Yes.  [facility]   Rex Kras, DO, [facility]  Pager: [phone] Office: [phone]

## 2022-07-06 ENCOUNTER — Encounter: Payer: Self-pay | Admitting: Cardiology

## 2022-07-29 ENCOUNTER — Encounter: Payer: Self-pay | Admitting: Cardiology

## 2022-07-29 NOTE — Telephone Encounter (Signed)
From patient.

## 2022-07-30 NOTE — Telephone Encounter (Signed)
From pt

## 2022-07-31 ENCOUNTER — Other Ambulatory Visit: Payer: Self-pay

## 2022-07-31 DIAGNOSIS — I2510 Atherosclerotic heart disease of native coronary artery without angina pectoris: Secondary | ICD-10-CM

## 2022-07-31 DIAGNOSIS — I10 Essential (primary) hypertension: Secondary | ICD-10-CM

## 2022-08-25 ENCOUNTER — Other Ambulatory Visit: Payer: Self-pay | Admitting: Cardiology

## 2022-08-28 NOTE — Progress Notes (Signed)
External Labs: Collected: 08/04/2022. BUN 24, creatinine 1.09. eGFR 75. Sodium 142, potassium 4.2, chloride 104, bicarb 23. AST 20, ALT 16, alkaline phosphatase 86. Total cholesterol 148, triglycerides 96, HDL 46, direct LDL 86

## 2022-09-10 ENCOUNTER — Other Ambulatory Visit: Payer: Self-pay | Admitting: Cardiology

## 2022-09-10 DIAGNOSIS — I209 Angina pectoris, unspecified: Secondary | ICD-10-CM

## 2022-12-07 ENCOUNTER — Other Ambulatory Visit: Payer: Self-pay | Admitting: Family Medicine

## 2022-12-07 DIAGNOSIS — I2510 Atherosclerotic heart disease of native coronary artery without angina pectoris: Secondary | ICD-10-CM

## 2022-12-07 NOTE — Progress Notes (Signed)
2 mo of increasing fatigue and low stamina.  Repeat Echo.

## 2022-12-11 ENCOUNTER — Other Ambulatory Visit: Payer: Self-pay | Admitting: Cardiology

## 2022-12-11 DIAGNOSIS — I209 Angina pectoris, unspecified: Secondary | ICD-10-CM

## 2022-12-28 ENCOUNTER — Ambulatory Visit (HOSPITAL_COMMUNITY): Payer: 59

## 2023-01-20 ENCOUNTER — Ambulatory Visit (HOSPITAL_COMMUNITY): Payer: 59

## 2023-01-28 ENCOUNTER — Ambulatory Visit (HOSPITAL_COMMUNITY)
Admission: RE | Admit: 2023-01-28 | Discharge: 2023-01-28 | Disposition: A | Discharge status: Home / Self Care | Payer: 59 | Source: Ambulatory Visit | Attending: Family Medicine | Admitting: Family Medicine

## 2023-01-28 DIAGNOSIS — R0600 Dyspnea, unspecified: Secondary | ICD-10-CM | POA: Diagnosis not present

## 2023-01-28 DIAGNOSIS — I2510 Atherosclerotic heart disease of native coronary artery without angina pectoris: Secondary | ICD-10-CM | POA: Insufficient documentation

## 2023-01-28 DIAGNOSIS — I10 Essential (primary) hypertension: Secondary | ICD-10-CM | POA: Diagnosis not present

## 2023-01-28 LAB — ECHOCARDIOGRAM COMPLETE
AR max vel: 2.38 cm2
AV Area VTI: 2.42 cm2
AV Area mean vel: 2.41 cm2
AV Mean grad: 3 mmHg
AV Peak grad: 6.7 mmHg
Ao pk vel: 1.29 m/s
Area-P 1/2: 4.01 cm2
Calc EF: 61.3 %
MV VTI: 2.81 cm2
S' Lateral: 2.5 cm
Single Plane A2C EF: 59.5 %
Single Plane A4C EF: 63.1 %

## 2023-01-28 NOTE — Progress Notes (Signed)
*  PRELIMINARY RESULTS* Echocardiogram 2D Echocardiogram has been performed.  Cory Hampton 01/28/2023, 3:57 PM

## 2023-03-14 ENCOUNTER — Other Ambulatory Visit: Payer: Self-pay | Admitting: Cardiology

## 2023-03-14 DIAGNOSIS — I209 Angina pectoris, unspecified: Secondary | ICD-10-CM

## 2023-05-17 ENCOUNTER — Other Ambulatory Visit (HOSPITAL_BASED_OUTPATIENT_CLINIC_OR_DEPARTMENT_OTHER): Payer: Self-pay

## 2023-05-17 ENCOUNTER — Ambulatory Visit: Payer: 59 | Admitting: Cardiology

## 2023-05-17 MED ORDER — LIDOCAINE VISCOUS HCL 2 % MT SOLN
OROMUCOSAL | 0 refills | Status: AC
  Filled 2023-05-17: qty 300, 20d supply, fill #0

## 2023-06-14 ENCOUNTER — Encounter: Payer: Self-pay | Admitting: Cardiology

## 2023-06-14 ENCOUNTER — Ambulatory Visit: Payer: 59 | Attending: Cardiology | Admitting: Cardiology

## 2023-06-14 VITALS — BP 120/82 | HR 71 | Resp 16 | Ht 69.0 in | Wt 167.8 lb

## 2023-06-14 DIAGNOSIS — E7800 Pure hypercholesterolemia, unspecified: Secondary | ICD-10-CM | POA: Diagnosis not present

## 2023-06-14 DIAGNOSIS — I10 Essential (primary) hypertension: Secondary | ICD-10-CM

## 2023-06-14 DIAGNOSIS — Z955 Presence of coronary angioplasty implant and graft: Secondary | ICD-10-CM | POA: Diagnosis not present

## 2023-06-14 DIAGNOSIS — I25119 Atherosclerotic heart disease of native coronary artery with unspecified angina pectoris: Secondary | ICD-10-CM | POA: Diagnosis not present

## 2023-06-14 DIAGNOSIS — I209 Angina pectoris, unspecified: Secondary | ICD-10-CM

## 2023-06-14 DIAGNOSIS — I2510 Atherosclerotic heart disease of native coronary artery without angina pectoris: Secondary | ICD-10-CM

## 2023-06-14 MED ORDER — PRALUENT 75 MG/ML ~~LOC~~ SOAJ
75.0000 mg | SUBCUTANEOUS | 0 refills | Status: DC
Start: 2023-06-14 — End: 2023-11-23

## 2023-06-14 MED ORDER — NITROGLYCERIN 0.4 MG SL SUBL: 0.4000 mg | SUBLINGUAL_TABLET | SUBLINGUAL | 0 refills | Status: AC | PRN

## 2023-06-14 NOTE — Patient Instructions (Signed)
Medication Instructions:  Your physician recommends that you continue on your current medications as directed. Please refer to the Current Medication list given to you today.  *If you need a refill on your cardiac medications before your next appointment, please call your pharmacy*  Lab Work: FASTING lipids, Direct LDL, CMP  If you have labs (blood work) drawn today and your tests are completely normal, you will receive your results only by: MyChart Message (if you have MyChart) OR A paper copy in the mail If you have any lab test that is abnormal or we need to change your treatment, we will call you to review the results.  Testing/Procedures: None ordered today.  Follow-Up: At [facility], you and your health needs are our priority.  As part of our continuing mission to provide you with exceptional heart care, we have created designated Provider Care Teams.  These Care Teams include your primary Cardiologist (physician) and Advanced Practice Providers (APPs -  Physician Assistants and Nurse Practitioners) who all work together to provide you with the care you need, when you need it.  Your next appointment:   1 year(s)  The format for your next appointment:   In Person  Provider:   None {

## 2023-06-14 NOTE — Progress Notes (Signed)
Cardiology Office Note:  .   Date:  06/14/2023  ID:  Cory Hampton, DOB 1956/05/26, MRN [id number] PCP:  Mila Palmer, MD  Former Cardiology Providers: None [facility] Providers Cardiologist:  Tessa Lerner, DO , [facility] (established care May 2022) Electrophysiologist:  None  Click to update primary MD,subspecialty MD or APP then REFRESH:1}    Chief Complaint  Patient presents with   Follow-up    1 year follow - CAD    History of Present Illness: Cory Hampton is a 67 y.o. Caucasian male whose past medical history and cardiovascular risk factors includes: Established coronary artery disease status post intervention, hypertension, hyperlipidemia, erectile dysfunction, restless leg syndrome.  Patient was referred to the practice in May 2022 for evaluation of chest pain.  His symptoms were very concerning for angina pectoris and underwent elective left heart catheterization.  He was noted to have a CTO of the RCA disease involving the LAD distribution as well.  He underwent angioplasty and stenting to the RCA and since then has been followed on an annual basis for CAD management.  In the past he has been intolerant to both statin therapy and Zetia.  He has been on PCSK9 inhibitors given his lipids and CAD diagnoses.  He presents today for a 1 year follow-up.  Denies anginal chest pain or heart failure symptoms. No recent labs available for review. Overall functional capacity remains stable.  Review of Systems: .   Review of Systems  Cardiovascular:  Negative for chest pain, claudication, dyspnea on exertion, irregular heartbeat, leg swelling, near-syncope, orthopnea, palpitations, paroxysmal nocturnal dyspnea and syncope.  Respiratory:  Negative for shortness of breath.   Hematologic/Lymphatic: Negative for bleeding problem.  Musculoskeletal:  Negative for muscle cramps and myalgias.  Neurological:  Negative for dizziness and light-headedness.    Studies  Reviewed:   EKG: EKG Interpretation Date/Time:  Monday June 14 2023 14:13:58 EDT Ventricular Rate:  71 PR Interval:  160 QRS Duration:  74 QT Interval:  386 QTC Calculation: 419 R Axis:   37  Text Interpretation: Normal sinus rhythm Normal ECG When compared with ECG of 01-Jan-2021 06:32, No significant change was found Confirmed by Tessa Lerner [phone]) on 06/14/2023 2:30:46 PM  Echocardiogram: 11/06/2020: LVEF 55 to 60%, no regional wall motion abnormalities, normal diastolic function, trivial MR.  01/28/2023  1. Left ventricular ejection fraction, by estimation, is 60 to 65%. The left ventricle has normal function. The left ventricle has no regional wall motion abnormalities. Left ventricular diastolic parameters were normal.   2. Right ventricular systolic function is normal. The right ventricular size is normal. There is normal pulmonary artery systolic pressure.   3. The mitral valve is normal in structure. Trivial mitral valve regurgitation. No evidence of mitral stenosis.   4. The aortic valve is tricuspid. Aortic valve regurgitation is not visualized. No aortic stenosis is present.   5. The inferior vena cava is normal in size with greater than 50% respiratory variability, suggesting right atrial pressure of 3 mmHg.   Comparison(s): No significant change from prior study. Prior images reviewed side by side.    Stress Testing: 11/12/2020 MPI: Nuclear stress EF: 63%. The left ventricular ejection fraction is normal (55-65%). There was no ST segment deviation noted during stress. No T wave inversion was noted during stress. Defect 1: There is a small defect of moderate severity present in the apex location. Findings consistent with ischemia. However, there is normal wall motion. Cannot rule out apical  thinning. This is a low risk study   Heart Catheterization: 12/31/2020: LV: Low LVEDP at 6 mmHg.  There was no pressure gradient across the aortic valve. LM: It is mildly  diseased, mild calcification evident, mid segment has a 20% stenosis. LAD: Diffusely diseased, proximal to mid segment has mild to moderate amount of calcification.  There are multiple tandem 30 to 60% stenosis.  D1 and D2 are small to moderate-sized. LAD gives contralateral collaterals to the RCA. RFR to proximal and mid LAD = 0.84, at most equivocal. CX: Very minimal disease.  Large OM1. RCA: CTO proximal segment.  Mild to moderate amount of calcification evident in the RCA in the midsegment.   Intervention data: Successful CTO PCI with implantation of 2.5 x 28 mm Synergy DES which was postdilated with 2.75 x 15 mm [place] at 18 atmospheric pressure giving 2.83 mm vessel.  Stenosis reduced from 100% to 0% with improvement in TIMI 0 to TIMI-3 flow. 150 mL contrast utilized.   Recommendation: Patient be discharged home in the morning, will need aggressive risk factor modification, he has statin intolerance, but is willing to try low-dose statin along with ezetimibe for now.  We will reevaluate this.  The LAD in the proximal to mid segment has diffuse tandem 50% stenosis took more 60% stenosis, RFR was negative for significant ischemia, needs aggressive risk factor modification and medical therapy.  RADIOLOGY: NA  Risk Assessment/Calculations:   NA  Labs:       Latest Ref Rng & Units 01/01/2021    1:09 AM  CBC  WBC 4.0 - 10.5 K/uL 8.2   Hemoglobin 13.0 - 17.0 g/dL 16.1   Hematocrit 09.6 - 52.0 % 42.8   Platelets 150 - 400 K/uL 247        Latest Ref Rng & Units 01/01/2021    1:09 AM  BMP  Glucose 70 - 99 mg/dL 045   BUN 8 - 23 mg/dL 20   Creatinine 4.09 - 1.24 mg/dL 8.11   Sodium 914 - 782 mmol/L 138   Potassium 3.5 - 5.1 mmol/L 3.3   Chloride 98 - 111 mmol/L 102   CO2 22 - 32 mmol/L 28   Calcium 8.9 - 10.3 mg/dL 9.2       Latest Ref Rng & Units 01/01/2021    1:09 AM  CMP  Glucose 70 - 99 mg/dL 956   BUN 8 - 23 mg/dL 20   Creatinine 2.13 - 1.24 mg/dL 0.86   Sodium 578  - 469 mmol/L 138   Potassium 3.5 - 5.1 mmol/L 3.3   Chloride 98 - 111 mmol/L 102   CO2 22 - 32 mmol/L 28   Calcium 8.9 - 10.3 mg/dL 9.2     No results found for: "CHOL", "HDL", "LDLCALC", "LDLDIRECT", "TRIG", "CHOLHDL" No results for input(s): "LIPOA" in the last 8760 hours. No components found for: "NTPROBNP" No results for input(s): "PROBNP" in the last 8760 hours. No results for input(s): "TSH" in the last 8760 hours.  External Labs: Collected: 08/04/2022. BUN 24, creatinine 1.09. eGFR 75. Sodium 142, potassium 4.2, chloride 104, bicarb 23. AST 20, ALT 16, alkaline phosphatase 86. Total cholesterol 148, triglycerides 96, HDL 46, direct LDL 86  Physical Exam:    Today's Vitals   06/14/23 1411  BP: 120/82  Pulse: 71  Resp: 16  SpO2: 97%  Weight: 167 lb 12.8 oz (76.1 kg)  Height: 5\' 9"  (1.753 m)   Body mass index is 24.78 kg/m. Wt Readings from  Last 3 Encounters:  06/14/23 167 lb 12.8 oz (76.1 kg)  05/15/22 168 lb (76.2 kg)  11/12/21 175 lb (79.4 kg)    Physical Exam  Constitutional: No distress.  hemodynamically stable  Neck: No JVD present.  Cardiovascular: Normal rate, regular rhythm, S1 normal, S2 normal, intact distal pulses and normal pulses. Exam reveals no gallop, no S3 and no S4.  No murmur heard. Pulmonary/Chest: Effort normal and breath sounds normal. No stridor. He has no wheezes. He has no rales.  Abdominal: Soft. Bowel sounds are normal. He exhibits no distension. There is no abdominal tenderness.  Musculoskeletal:        General: No edema.     Cervical back: Neck supple.  Neurological: He is alert and oriented to person, place, and time. He has intact cranial nerves (2-12).  Skin: Skin is warm and moist.     Impression & Recommendation(s):  Impression:   ICD-10-CM   1. Coronary artery disease involving native coronary artery of native heart without angina pectoris  I25.10 EKG 12-Lead    nitroGLYCERIN (NITROSTAT) 0.4 MG SL tablet    Alirocumab  (PRALUENT) 75 MG/ML SOAJ    LDL cholesterol, direct    Lipid panel    Comprehensive metabolic panel    Comprehensive metabolic panel    Lipid panel    LDL cholesterol, direct    2. History of coronary angioplasty with insertion of stent  Z95.5 Alirocumab (PRALUENT) 75 MG/ML SOAJ    LDL cholesterol, direct    Lipid panel    Comprehensive metabolic panel    Comprehensive metabolic panel    Lipid panel    LDL cholesterol, direct    3. Hypertension, essential  I10     4. Pure hypercholesterolemia  E78.00 LDL cholesterol, direct    Lipid panel    Comprehensive metabolic panel    Comprehensive metabolic panel    Lipid panel    LDL cholesterol, direct    5. Angina pectoris (HCC)  I20.9        Recommendation(s):  Coronary artery disease involving native coronary artery of native heart without angina pectoris History of coronary angioplasty with insertion of stent Presents to the office for a 1 year follow-up visit given his CAD and prior coronary interventions. Continue aspirin 81 mg p.o. daily. Continue Praluent 75 mg every 14 days. Will check fasting lipid profile. EKG is nonischemic. Refilled sublingual nitroglycerin tablets. Continue current medical therapy.  Hypertension, essential Office blood pressures are very well-controlled. At times he does feel tired and fatigued. I do not think this is secondary to his current blood pressure.  However shared decision was to reduce hydrochlorothiazide to 12.5 mg p.o. daily to see if he feels better.  Pure hypercholesterolemia Currently on Praluent.   He denies myalgia or other side effects. Will order fasting lipid profile.    Orders Placed:  Orders Placed This Encounter  Procedures   LDL cholesterol, direct    Standing Status:   Future    Number of Occurrences:   1    Standing Expiration Date:   06/13/2024   Lipid panel    Standing Status:   Future    Number of Occurrences:   1    Standing Expiration Date:   06/13/2024     Order Specific Question:   Has the patient fasted?    Answer:   Yes   Comprehensive metabolic panel    Standing Status:   Future    Number of Occurrences:   1  Standing Expiration Date:   06/13/2024    Order Specific Question:   Has the patient fasted?    Answer:   Yes   EKG 12-Lead     Final Medication List:    Meds ordered this encounter  Medications   nitroGLYCERIN (NITROSTAT) 0.4 MG SL tablet    Sig: Place 1 tablet (0.4 mg total) under the tongue every 5 (five) minutes as needed for chest pain. If you require more than two tablets five minutes apart go to the nearest ER via EMS.    Dispense:  30 tablet    Refill:  0   Alirocumab (PRALUENT) 75 MG/ML SOAJ    Sig: Inject 1 mL (75 mg total) into the skin every 14 (fourteen) days.    Dispense:  6 mL    Refill:  0    Medications Discontinued During This Encounter  Medication Reason   nitroGLYCERIN (NITROSTAT) 0.4 MG SL tablet Reorder     Current Outpatient Medications:    acetaminophen (TYLENOL) 650 MG CR tablet, Take 650-1,300 mg by mouth every 8 (eight) hours as needed for pain., Disp: , Rfl:    Alirocumab (PRALUENT) 75 MG/ML SOAJ, Inject 1 mL (75 mg total) into the skin every 14 (fourteen) days., Disp: 6 mL, Rfl: 0   aspirin EC 81 MG tablet, Take 81 mg by mouth at bedtime. Swallow whole., Disp: , Rfl:    cetirizine (ZYRTEC) 10 MG tablet, Take 10 mg by mouth daily as needed for allergies., Disp: , Rfl:    Cholecalciferol (VITAMIN D) 2000 units CAPS, Take 2,000 Units by mouth daily., Disp: , Rfl:    Coenzyme Q10 (COQ10) 200 MG CAPS, Take 200 mg by mouth every evening., Disp: , Rfl:    diclofenac Sodium (VOLTAREN) 1 % GEL, Apply 1 application topically 4 (four) times daily as needed (pain)., Disp: , Rfl:    Flaxseed, Linseed, (FLAXSEED OIL) 1200 MG CAPS, Take 1,200 mg by mouth in the morning and at bedtime. W/Omega 3 540mg , Disp: , Rfl:    fluticasone (FLONASE) 50 MCG/ACT nasal spray, Place into both nostrils daily., Disp:  , Rfl:    Glucos-Chond-Hyal Ac-Ca Fructo (MOVE FREE JOINT HEALTH ADVANCE PO), Take 1 tablet by mouth daily., Disp: , Rfl:    hydrochlorothiazide (HYDRODIURIL) 25 MG tablet, Take 25 mg by mouth in the morning., Disp: , Rfl:    ketotifen (ZADITOR) 0.025 % ophthalmic solution, Place 1-2 drops into both eyes at bedtime as needed (allergies.)., Disp: , Rfl:    losartan (COZAAR) 50 MG tablet, Take 50 mg by mouth at bedtime., Disp: , Rfl:    magic mouthwash (lidocaine, diphenhydrAMINE, alum & mag hydroxide) suspension, Swish and spit 5ml by mouth 3 times adaily as directed, Disp: 300 mL, Rfl: 0   MAGNESIUM CITRATE PO, Take 250 mg by mouth every evening., Disp: , Rfl:    Multiple Vitamin (MULTIVITAMIN WITH MINERALS) TABS tablet, Take 1 tablet by mouth daily., Disp: , Rfl:    omalizumab (XOLAIR) 150 MG injection, Inject into the skin every 28 (twenty-eight) days., Disp: , Rfl:    simvastatin (ZOCOR) 20 MG tablet, TAKE 1 TABLET BY MOUTH EVERY DAY, Disp: 90 tablet, Rfl: 0   Homeopathic Products (SIMILASAN ALLERGY EYE RELIEF OP), Place 1-2 drops into both eyes 3 (three) times daily as needed (dry/allergy eyes)., Disp: , Rfl:    metoprolol succinate (TOPROL-XL) 25 MG 24 hr tablet, TAKE 1 TABLET (25 MG TOTAL) BY MOUTH DAILY., Disp: 90 tablet, Rfl: 0  nitroGLYCERIN (NITROSTAT) 0.4 MG SL tablet, Place 1 tablet (0.4 mg total) under the tongue every 5 (five) minutes as needed for chest pain. If you require more than two tablets five minutes apart go to the nearest ER via EMS., Disp: 30 tablet, Rfl: 0  Consent:   NA  Disposition:   1 year follow-up sooner if needed.  His questions and concerns were addressed to his satisfaction. He voices understanding of the recommendations provided during this encounter.    Signed, Tessa Lerner, DO, [facility]  [address] [place], [place] [address] 06/14/2023 2:59 PM

## 2023-06-17 LAB — COMPREHENSIVE METABOLIC PANEL: EGFR: 79

## 2023-06-20 ENCOUNTER — Other Ambulatory Visit: Payer: Self-pay | Admitting: Cardiology

## 2023-06-20 DIAGNOSIS — I209 Angina pectoris, unspecified: Secondary | ICD-10-CM

## 2023-06-20 NOTE — Progress Notes (Signed)
External Labs: Collected: 06/16/2023 provided by the patient. BUN 19, creatinine 1.04. eGFR 79. Potassium 4. AST and ALT within normal limits. Total cholesterol 112, triglycerides 157, HDL 37, LDL calculated 48  Outside labs reviewed. Please inform the patient the LDL is at goal. Triglycerides are not at goal-should be less than 149 mg/dL.  Please encourage him to consume foods that are low in triglycerides and to have this reevaluated.  If the numbers consistently stay above goal may need to consider pharmacological therapy given his CAD and prior interventions.  Soledad Budreau [place], DO, [facility]

## 2023-07-05 ENCOUNTER — Telehealth: Payer: Self-pay

## 2023-07-05 NOTE — Telephone Encounter (Signed)
Pharmacy Patient Advocate Encounter   Received notification from Fax that prior authorization for PRALUENT is required/requested.   Form completed and faxed back to plan.

## 2023-07-07 ENCOUNTER — Other Ambulatory Visit (HOSPITAL_COMMUNITY): Payer: Self-pay

## 2023-07-07 NOTE — Telephone Encounter (Signed)
Pharmacy Patient Advocate Encounter  Received notification from [facility] that Prior Authorization for PRALUENT has been APPROVED from 07/06/23 to 07/05/24

## 2023-11-22 ENCOUNTER — Other Ambulatory Visit: Payer: Self-pay | Admitting: Cardiology

## 2023-11-22 DIAGNOSIS — Z955 Presence of coronary angioplasty implant and graft: Secondary | ICD-10-CM

## 2023-11-22 DIAGNOSIS — I2510 Atherosclerotic heart disease of native coronary artery without angina pectoris: Secondary | ICD-10-CM

## 2024-04-25 ENCOUNTER — Telehealth: Payer: Self-pay | Admitting: Cardiology

## 2024-04-25 DIAGNOSIS — I10 Essential (primary) hypertension: Secondary | ICD-10-CM

## 2024-04-25 DIAGNOSIS — E7800 Pure hypercholesterolemia, unspecified: Secondary | ICD-10-CM

## 2024-04-25 NOTE — Telephone Encounter (Signed)
  Patient would like to know what lab work he need to get prior his appt with Dr. Michele. He requested if the information can be send to his mychart

## 2024-04-26 NOTE — Telephone Encounter (Signed)
 Fasting lipids, CMP, and Lpa - he usually uses [facility] at his work.   Jaser Fullen [place], DO, FACC

## 2024-04-26 NOTE — Telephone Encounter (Signed)
 Called and spoke with patient. Advised patient he will need to have a fasting lipid panel, Lpa and CMP at any [facility] location prior to his appointment with Dr. Michele on 06/27/24 per Dr. Michele. Patient verbalized understanding and agreeable to plan. Lab orders placed.

## 2024-06-12 ENCOUNTER — Encounter: Payer: Self-pay | Admitting: Cardiology

## 2024-06-12 ENCOUNTER — Other Ambulatory Visit (HOSPITAL_COMMUNITY): Payer: Self-pay

## 2024-06-12 ENCOUNTER — Telehealth: Payer: Self-pay | Admitting: Pharmacy Technician

## 2024-06-12 NOTE — Telephone Encounter (Signed)
 Please review and advise.

## 2024-06-12 NOTE — Telephone Encounter (Signed)
   Hi, insurance is asking for repatha  now. Can he be changed to repatha  again?

## 2024-06-12 NOTE — Telephone Encounter (Signed)
   Pharmacy Patient Advocate Encounter   Received notification from Onbase that prior authorization for praluent  is required/requested.   Insurance verification completed.   The patient is insured through [facility].   Per test claim: PA required; PA started via CoverMyMeds. KEY [id number] . Waiting for clinical questions to populate.

## 2024-06-15 ENCOUNTER — Encounter: Payer: Self-pay | Admitting: Cardiology

## 2024-06-16 ENCOUNTER — Other Ambulatory Visit (HOSPITAL_COMMUNITY): Payer: Self-pay

## 2024-06-16 NOTE — Telephone Encounter (Signed)
Thanks for the update.   Ezell Poke [place], DO, [facility]

## 2024-06-16 NOTE — Telephone Encounter (Signed)
 Pharmacy Patient Advocate Encounter   Received notification from Physician's Office that prior authorization for PRALUENT  is required/requested.   Insurance verification completed.   The patient is insured through [facility].   Per test claim: PA required; PA submitted to above mentioned insurance via Latent Key/confirmation #/[id number] [facility] Status is pending

## 2024-06-16 NOTE — Telephone Encounter (Signed)
 Patient with back spasms on Repatha . Please try to Praluent 

## 2024-06-19 ENCOUNTER — Encounter: Payer: Self-pay | Admitting: Cardiology

## 2024-06-19 ENCOUNTER — Other Ambulatory Visit (HOSPITAL_COMMUNITY): Payer: Self-pay

## 2024-06-19 NOTE — Telephone Encounter (Signed)
 Pharmacy Patient Advocate Encounter  Received notification from [facility] that Prior Authorization for PRALUENT  has been APPROVED from 06/17/24 to 06/17/25. Ran test claim, Copay is $80. This test claim was processed through [facility]- copay amounts may vary at other pharmacies due to pharmacy/plan contracts, or as the patient moves through the different stages of their insurance plan.

## 2024-06-21 ENCOUNTER — Other Ambulatory Visit: Payer: Self-pay | Admitting: Cardiology

## 2024-06-21 DIAGNOSIS — I2510 Atherosclerotic heart disease of native coronary artery without angina pectoris: Secondary | ICD-10-CM

## 2024-06-21 DIAGNOSIS — Z955 Presence of coronary angioplasty implant and graft: Secondary | ICD-10-CM

## 2024-06-21 NOTE — Telephone Encounter (Signed)
 Thanks for the update. Please let me know if anything else needs to be done.   Tajha Sammarco [place], DO, FACC

## 2024-06-25 ENCOUNTER — Other Ambulatory Visit: Payer: Self-pay | Admitting: Cardiology

## 2024-06-25 DIAGNOSIS — I209 Angina pectoris, unspecified: Secondary | ICD-10-CM

## 2024-06-27 ENCOUNTER — Ambulatory Visit: Admitting: Cardiology

## 2024-07-25 ENCOUNTER — Ambulatory Visit: Admitting: Cardiology

## 2024-08-02 ENCOUNTER — Ambulatory Visit: Attending: Cardiology | Admitting: Cardiology

## 2024-08-02 ENCOUNTER — Encounter: Payer: Self-pay | Admitting: Cardiology

## 2024-08-02 ENCOUNTER — Other Ambulatory Visit: Payer: Self-pay | Admitting: *Deleted

## 2024-08-02 VITALS — BP 142/80 | HR 76 | Resp 16 | Ht 69.0 in | Wt 172.4 lb

## 2024-08-02 DIAGNOSIS — E7800 Pure hypercholesterolemia, unspecified: Secondary | ICD-10-CM

## 2024-08-02 DIAGNOSIS — Z955 Presence of coronary angioplasty implant and graft: Secondary | ICD-10-CM

## 2024-08-02 DIAGNOSIS — I2510 Atherosclerotic heart disease of native coronary artery without angina pectoris: Secondary | ICD-10-CM

## 2024-08-02 DIAGNOSIS — I10 Essential (primary) hypertension: Secondary | ICD-10-CM | POA: Diagnosis not present

## 2024-08-02 MED ORDER — NEXLIZET 180-10 MG PO TABS: 1.0000 | ORAL_TABLET | Freq: Every day | ORAL | 3 refills | Status: AC

## 2024-08-02 NOTE — Patient Instructions (Signed)
 Medication Instructions:  Please start Nexilizet one tablet daily. Continue all other medications as listed.  *If you need a refill on your cardiac medications before your next appointment, please call your pharmacy*  Lab Work: Please have blood work in 6 weeks (CMP, fasting Lipid and LPa)  If you have labs (blood work) drawn today and your tests are completely normal, you will receive your results only by: MyChart Message (if you have MyChart) OR A paper copy in the mail If you have any lab test that is abnormal or we need to change your treatment, we will call you to review the results.  Testing/Procedures: Your physician has requested that you have a carotid duplex in November 2026. This test is an ultrasound of the carotid arteries in your neck. It looks at blood flow through these arteries that supply the brain with blood. Allow one hour for this exam. There are no restrictions or special instructions.   Follow-Up: At [facility], you and your health needs are our priority.  As part of our continuing mission to provide you with exceptional heart care, our providers are all part of one team.  This team includes your primary Cardiologist (physician) and Advanced Practice Providers or APPs (Physician Assistants and Nurse Practitioners) who all work together to provide you with the care you need, when you need it.  Your next appointment:   1 year(s)  Provider:   Madonna Large, DO    We recommend signing up for the patient portal called MyChart.  Sign up information is provided on this After Visit Summary.  MyChart is used to connect with patients for Virtual Visits (Telemedicine).  Patients are able to view lab/test results, encounter notes, upcoming appointments, etc.  Non-urgent messages can be sent to your provider as well.   To learn more about what you can do with MyChart, go to [email or web address].

## 2024-08-02 NOTE — Progress Notes (Signed)
 Cardiology Office Note:  .   ID:  Cory Hampton, DOB 19-Oct-1955, MRN [id number] PCP:  Verena Mems, MD  Former Cardiology Providers: None [facility] Providers Cardiologist:  Madonna Large, DO , [facility] (established care May 2022) Electrophysiologist:  None  Click to update primary MD,subspecialty MD or APP then REFRESH:1}    Chief Complaint  Patient presents with   Coronary artery disease involving native coronary artery of   Follow-up    History of Present Illness: Cory Hampton is a 68 y.o. Caucasian male whose past medical history and cardiovascular risk factors includes: Established coronary artery disease status post intervention, hypertension, hyperlipidemia, erectile dysfunction, restless leg syndrome.  Patient was referred to the practice in May 2022 for evaluation of chest pain.  His symptoms were very concerning for angina pectoris and underwent elective left heart catheterization.  He was noted to have a CTO of the RCA disease and LAD disease. He underwent angioplasty and stenting to the RCA and since then has been followed on an annual basis for CAD management.  In the past he has been intolerant to both statin therapy and Zetia .  He has been on PCSK9 inhibitors given his lipids and CAD diagnoses.  Since last office visit patient reached out stating that Repatha  was likely causing him to have spasms.  He was transitioned to Praluent .  He presents today for a 1 year follow-up.  Since last office visit Cory Hampton denies any anginal chest pain or heart failure symptoms.   No hospitalizations or urgent care visits for cardiovascular reasons.   He has been compliant with his medical therapy, endorses no issues.    No use of sublingual nitroglycerin  tablets since the last office visit.   Physical endurance remains stable, ambulating at least 10,000 steps per day.  Home SBP ranges between 120-130 mmHg. No prior history of gout Despite his current  lipid-lowering agents his LDL levels are still at 68 mg/dL.    Review of Systems: .   Review of Systems  Cardiovascular:  Negative for chest pain, claudication, dyspnea on exertion, irregular heartbeat, leg swelling, near-syncope, orthopnea, palpitations, paroxysmal nocturnal dyspnea and syncope.  Respiratory:  Negative for shortness of breath.   Hematologic/Lymphatic: Negative for bleeding problem.  Musculoskeletal:  Negative for muscle cramps and myalgias.  Neurological:  Negative for dizziness and light-headedness.    Studies Reviewed:   EKG: EKG Interpretation Date/Time:  Wednesday August 02 2024 10:17:30 EST Ventricular Rate:  71 PR Interval:  168 QRS Duration:  76 QT Interval:  384 QTC Calculation: 417 R Axis:   47  Text Interpretation: Normal sinus rhythm Normal ECG When compared with ECG of 14-Jun-2023 14:13, No significant change was found Confirmed by Large Madonna [phone]) on 08/02/2024 10:22:13 AM  Echocardiogram: 11/06/2020: LVEF 55 to 60%, no regional wall motion abnormalities, normal diastolic function, trivial MR.  01/28/2023  1. Left ventricular ejection fraction, by estimation, is 60 to 65%. The left ventricle has normal function. The left ventricle has no regional wall motion abnormalities. Left ventricular diastolic parameters were normal.   2. Right ventricular systolic function is normal. The right ventricular size is normal. There is normal pulmonary artery systolic pressure.   3. The mitral valve is normal in structure. Trivial mitral valve regurgitation. No evidence of mitral stenosis.   4. The aortic valve is tricuspid. Aortic valve regurgitation is not visualized. No aortic stenosis is present.   5. The inferior vena cava is normal in  size with greater than 50% respiratory variability, suggesting right atrial pressure of 3 mmHg.   Comparison(s): No significant change from prior study. Prior images reviewed side by side.    Stress Testing: 11/12/2020  MPI: Nuclear stress EF: 63%. The left ventricular ejection fraction is normal (55-65%). There was no ST segment deviation noted during stress. No T wave inversion was noted during stress. Defect 1: There is a small defect of moderate severity present in the apex location. Findings consistent with ischemia. However, there is normal wall motion. Cannot rule out apical thinning. This is a low risk study   Heart Catheterization: 12/31/2020: LV: Low LVEDP at 6 mmHg.  There was no pressure gradient across the aortic valve. LM: It is mildly diseased, mild calcification evident, mid segment has a 20% stenosis. LAD: Diffusely diseased, proximal to mid segment has mild to moderate amount of calcification.  There are multiple tandem 30 to 60% stenosis.  D1 and D2 are small to moderate-sized. LAD gives contralateral collaterals to the RCA. RFR to proximal and mid LAD = 0.84, at most equivocal. CX: Very minimal disease.  Large OM1. RCA: CTO proximal segment.  Mild to moderate amount of calcification evident in the RCA in the midsegment.   Intervention data: Successful CTO PCI with implantation of 2.5 x 28 mm Synergy DES which was postdilated with 2.75 x 15 mm [place] at 18 atmospheric pressure giving 2.83 mm vessel.  Stenosis reduced from 100% to 0% with improvement in TIMI 0 to TIMI-3 flow. 150 mL contrast utilized.   Recommendation: Patient be discharged home in the morning, will need aggressive risk factor modification, he has statin intolerance, but is willing to try low-dose statin along with ezetimibe  for now.  We will reevaluate this.  The LAD in the proximal to mid segment has diffuse tandem 50% stenosis took more 60% stenosis, RFR was negative for significant ischemia, needs aggressive risk factor modification and medical therapy.  RADIOLOGY: NA  Risk Assessment/Calculations:   NA  Labs:       Latest Ref Rng & Units 01/01/2021    1:09 AM  CBC  WBC 4.0 - 10.5 K/uL 8.2   Hemoglobin  13.0 - 17.0 g/dL 85.5   Hematocrit 60.9 - 52.0 % 42.8   Platelets 150 - 400 K/uL 247        Latest Ref Rng & Units 01/01/2021    1:09 AM  BMP  Glucose 70 - 99 mg/dL 895   BUN 8 - 23 mg/dL 20   Creatinine 9.38 - 1.24 mg/dL 9.04   Sodium 864 - 854 mmol/L 138   Potassium 3.5 - 5.1 mmol/L 3.3   Chloride 98 - 111 mmol/L 102   CO2 22 - 32 mmol/L 28   Calcium 8.9 - 10.3 mg/dL 9.2       Latest Ref Rng & Units 01/01/2021    1:09 AM  CMP  Glucose 70 - 99 mg/dL 895   BUN 8 - 23 mg/dL 20   Creatinine 9.38 - 1.24 mg/dL 9.04   Sodium 864 - 854 mmol/L 138   Potassium 3.5 - 5.1 mmol/L 3.3   Chloride 98 - 111 mmol/L 102   CO2 22 - 32 mmol/L 28   Calcium 8.9 - 10.3 mg/dL 9.2     No results found for: CHOL, HDL, LDLCALC, LDLDIRECT, TRIG, CHOLHDL No results for input(s): LIPOA in the last 8760 hours. No components found for: NTPROBNP No results for input(s): PROBNP in the last 8760 hours. No  results for input(s): TSH in the last 8760 hours.  External Labs: Collected: 08/04/2022. BUN 24, creatinine 1.09. eGFR 75. Sodium 142, potassium 4.2, chloride 104, bicarb 23. AST 20, ALT 16, alkaline phosphatase 86. Total cholesterol 148, triglycerides 96, HDL 46, direct LDL 86  External Labs: Collected: 06/13/2024 [facility] database. Total cholesterol 132, triglycerides 93, HDL 46, LDL calculated 68  Physical Exam:    Today's Vitals   08/02/24 1015  BP: (!) 142/80  Pulse: 76  Resp: 16  SpO2: 99%  Weight: 172 lb 6.4 oz (78.2 kg)  Height: 5' 9 (1.753 m)    Body mass index is 25.46 kg/m. Wt Readings from Last 3 Encounters:  08/02/24 172 lb 6.4 oz (78.2 kg)  06/14/23 167 lb 12.8 oz (76.1 kg)  05/15/22 168 lb (76.2 kg)    Physical Exam  Constitutional: No distress.  hemodynamically stable  Neck: No JVD present.  Cardiovascular: Normal rate, regular rhythm, S1 normal, S2 normal, intact distal pulses and normal pulses. Exam reveals no gallop, no S3 and no S4.  No  murmur heard. Pulmonary/Chest: Effort normal and breath sounds normal. No stridor. He has no wheezes. He has no rales.  Abdominal: Soft. Bowel sounds are normal. He exhibits no distension. There is no abdominal tenderness.  Musculoskeletal:        General: No edema.     Cervical back: Neck supple.  Neurological: He is alert and oriented to person, place, and time. He has intact cranial nerves (2-12).  Skin: Skin is warm and moist.     Impression & Recommendation(s):  Impression:   ICD-10-CM   1. Coronary artery disease involving native coronary artery of native heart without angina pectoris  I25.10 EKG 12-Lead    Lipoprotein A (LPA)    VAS US  CAROTID    Bempedoic Acid-Ezetimibe  (NEXLIZET ) 180-10 MG TABS    2. History of coronary angioplasty with insertion of stent  Z95.5 Bempedoic Acid-Ezetimibe  (NEXLIZET ) 180-10 MG TABS    3. Pure hypercholesterolemia  E78.00 Lipid panel    Comprehensive metabolic panel with GFR    Bempedoic Acid-Ezetimibe  (NEXLIZET ) 180-10 MG TABS    4. Hypertension, essential  I10 Comprehensive metabolic panel with GFR       Recommendation(s):  Coronary artery disease involving native coronary artery of native heart without angina pectoris History of coronary angioplasty with insertion of stent Denies anginal chest pain EKG is nonischemic Physical endurance remains stable Will hold off on stress testing at this time since he is asymptomatic Given the degree of CAD recommend carotid duplex to evaluate for either atherosclerosis or stenosis Recommended goal LDL at least less than 70 mg/dL or close to 55 mg/dL Currently on Praluent  Start Nexlizet  180/10 mg p.o. daily Fasting lipids, LP(a) and CMP in 6 weeks to reevaluate Lipids/AST/ALT Further recommendations to follow  Hypertension, essential Office blood pressures are elevated. Home blood pressures are very well-controlled between 120-130 mmHg. Continue hydrochlorothiazide 12.5 mg p.o. daily. Continue  losartan  50 mg p.o. every afternoon. Continue Toprol -XL 25 mg p.o. every morning. Patient states that he is less lightheaded and dizzy after down titration of HCTZ in the past  Pure hypercholesterolemia Currently on Praluent . Shared decision was to start Nexlizet .  See above  Orders Placed:  Orders Placed This Encounter  Procedures   Lipid panel    Standing Status:   Future    Number of Occurrences:   1    Expected Date:   09/13/2024    Expiration Date:   08/02/2025  Comprehensive metabolic panel with GFR    Standing Status:   Future    Number of Occurrences:   1    Expected Date:   09/13/2024    Expiration Date:   08/02/2025   Lipoprotein A (LPA)    Standing Status:   Future    Number of Occurrences:   1    Expected Date:   09/13/2024    Expiration Date:   08/02/2025   EKG 12-Lead     Final Medication List:    Meds ordered this encounter  Medications   Bempedoic Acid-Ezetimibe  (NEXLIZET ) 180-10 MG TABS    Sig: Take 1 tablet by mouth daily.    Dispense:  90 tablet    Refill:  3    There are no discontinued medications.    Current Outpatient Medications:    acetaminophen  (TYLENOL ) 650 MG CR tablet, Take 650-1,300 mg by mouth every 8 (eight) hours as needed for pain., Disp: , Rfl:    Alirocumab  (PRALUENT ) 75 MG/ML SOAJ, INJECT 1 ML (75 MG TOTAL) INTO THE SKIN EVERY 14 (FOURTEEN) DAYS., Disp: 6 mL, Rfl: 0   aspirin  EC 81 MG tablet, Take 81 mg by mouth at bedtime. Swallow whole., Disp: , Rfl:    Bempedoic Acid-Ezetimibe  (NEXLIZET ) 180-10 MG TABS, Take 1 tablet by mouth daily., Disp: 90 tablet, Rfl: 3   cetirizine (ZYRTEC) 10 MG tablet, Take 10 mg by mouth daily as needed for allergies., Disp: , Rfl:    Cholecalciferol (VITAMIN D) 2000 units CAPS, Take 2,000 Units by mouth daily., Disp: , Rfl:    Coenzyme Q10 (COQ10) 200 MG CAPS, Take 200 mg by mouth every evening., Disp: , Rfl:    diclofenac Sodium (VOLTAREN) 1 % GEL, Apply 1 application topically 4 (four) times daily as  needed (pain)., Disp: , Rfl:    Flaxseed, Linseed, (FLAXSEED OIL) 1200 MG CAPS, Take 1,200 mg by mouth in the morning and at bedtime. W/Omega 3 540mg , Disp: , Rfl:    fluticasone (FLONASE) 50 MCG/ACT nasal spray, Place into both nostrils daily., Disp: , Rfl:    Glucos-Chond-Hyal Ac-Ca Fructo (MOVE FREE JOINT HEALTH ADVANCE PO), Take 1 tablet by mouth daily., Disp: , Rfl:    Homeopathic Products [facility] ALLERGY EYE RELIEF OP), Place 1-2 drops into both eyes 3 (three) times daily as needed (dry/allergy eyes)., Disp: , Rfl:    hydrochlorothiazide (HYDRODIURIL) 25 MG tablet, Take 25 mg by mouth in the morning., Disp: , Rfl:    ketotifen (ZADITOR) 0.025 % ophthalmic solution, Place 1-2 drops into both eyes at bedtime as needed (allergies.)., Disp: , Rfl:    losartan  (COZAAR ) 50 MG tablet, Take 50 mg by mouth at bedtime., Disp: , Rfl:    magic mouthwash (lidocaine , diphenhydrAMINE, alum & mag hydroxide) suspension, Swish and spit 5ml by mouth 3 times adaily as directed, Disp: 300 mL, Rfl: 0   MAGNESIUM CITRATE PO, Take 250 mg by mouth every evening., Disp: , Rfl:    metoprolol  succinate (TOPROL -XL) 25 MG 24 hr tablet, TAKE 1 TABLET (25 MG TOTAL) BY MOUTH DAILY., Disp: 90 tablet, Rfl: 0   Multiple Vitamin (MULTIVITAMIN WITH MINERALS) TABS tablet, Take 1 tablet by mouth daily., Disp: , Rfl:    nitroGLYCERIN  (NITROSTAT ) 0.4 MG SL tablet, Place 1 tablet (0.4 mg total) under the tongue every 5 (five) minutes as needed for chest pain. If you require more than two tablets five minutes apart go to the nearest ER via EMS., Disp: 30 tablet, Rfl: 0  omalizumab (XOLAIR) 150 MG injection, Inject into the skin every 28 (twenty-eight) days., Disp: , Rfl:    sildenafil (VIAGRA) 25 MG tablet, Take 12.5 mg by mouth daily as needed for erectile dysfunction., Disp: , Rfl:    simvastatin  (ZOCOR ) 20 MG tablet, TAKE 1 TABLET BY MOUTH EVERY DAY, Disp: 90 tablet, Rfl: 0  Consent:   NA  Disposition:   1 year follow-up  sooner if needed.  His questions and concerns were addressed to his satisfaction. He voices understanding of the recommendations provided during this encounter.    Signed, Madonna Michele HAS, [facility]  A Division of [facility] [address] [place],  [address]

## 2024-08-05 ENCOUNTER — Encounter: Payer: Self-pay | Admitting: Cardiology

## 2024-09-12 ENCOUNTER — Other Ambulatory Visit: Payer: Self-pay | Admitting: Cardiology

## 2024-09-12 DIAGNOSIS — I2510 Atherosclerotic heart disease of native coronary artery without angina pectoris: Secondary | ICD-10-CM

## 2024-09-12 DIAGNOSIS — Z955 Presence of coronary angioplasty implant and graft: Secondary | ICD-10-CM

## 2024-09-27 ENCOUNTER — Encounter: Payer: Self-pay | Admitting: Cardiology

## 2024-09-28 NOTE — Telephone Encounter (Signed)
 Covering for Dr. Michele, will let Dr. Michele to decide when he come back. From my perspective, switching from losartan  to amlodipine is ok, but may not be necessary. Patient is correct in that all three medications affect kidney, but they do so differently. Whereas ibuprofen affect kidney function and cause sodium/fluid retention, hydrochlorothiazide get rid of excess fluid. Losartan  affect kidney by dilating efferent arterioles thereby reduce glomerular hypertension. In diabetic patient (not Mr. Longoria), losartan  is actually renal protective.
